# Patient Record
Sex: Female | Born: 1950 | Marital: Single | Smoking: Never smoker
Health system: Southern US, Community
[De-identification: ages and names within clinical notes are randomized; demographics above are authoritative.]

## PROBLEM LIST (undated history)

## (undated) DIAGNOSIS — E119 Type 2 diabetes mellitus without complications: Secondary | ICD-10-CM

## (undated) DIAGNOSIS — F419 Anxiety disorder, unspecified: Secondary | ICD-10-CM

## (undated) DIAGNOSIS — N9489 Other specified conditions associated with female genital organs and menstrual cycle: Secondary | ICD-10-CM

## (undated) DIAGNOSIS — C541 Malignant neoplasm of endometrium: Secondary | ICD-10-CM

## (undated) DIAGNOSIS — C181 Malignant neoplasm of appendix: Secondary | ICD-10-CM

## (undated) HISTORY — DX: Malignant neoplasm of appendix: C18.1

## (undated) HISTORY — PX: APPENDECTOMY: SHX54

## (undated) HISTORY — DX: Other specified conditions associated with female genital organs and menstrual cycle: N94.89

## (undated) HISTORY — DX: Malignant neoplasm of endometrium: C54.1

## (undated) HISTORY — PX: NO PAST SURGERIES: SHX2092

## (undated) HISTORY — DX: Anxiety disorder, unspecified: F41.9

---

## 2012-06-10 DIAGNOSIS — M79673 Pain in unspecified foot: Secondary | ICD-10-CM | POA: Insufficient documentation

## 2012-12-09 DIAGNOSIS — E6609 Other obesity due to excess calories: Secondary | ICD-10-CM | POA: Insufficient documentation

## 2015-11-28 DIAGNOSIS — F419 Anxiety disorder, unspecified: Secondary | ICD-10-CM | POA: Diagnosis not present

## 2015-11-28 DIAGNOSIS — E669 Obesity, unspecified: Secondary | ICD-10-CM | POA: Diagnosis not present

## 2017-06-24 DIAGNOSIS — F419 Anxiety disorder, unspecified: Secondary | ICD-10-CM | POA: Diagnosis not present

## 2017-11-17 DIAGNOSIS — F419 Anxiety disorder, unspecified: Secondary | ICD-10-CM | POA: Insufficient documentation

## 2017-11-17 DIAGNOSIS — F32A Depression, unspecified: Secondary | ICD-10-CM | POA: Insufficient documentation

## 2017-11-17 DIAGNOSIS — R399 Unspecified symptoms and signs involving the genitourinary system: Secondary | ICD-10-CM | POA: Diagnosis not present

## 2017-11-17 DIAGNOSIS — N3090 Cystitis, unspecified without hematuria: Secondary | ICD-10-CM | POA: Diagnosis not present

## 2017-12-30 DIAGNOSIS — F419 Anxiety disorder, unspecified: Secondary | ICD-10-CM | POA: Diagnosis not present

## 2017-12-30 DIAGNOSIS — F329 Major depressive disorder, single episode, unspecified: Secondary | ICD-10-CM | POA: Diagnosis not present

## 2018-05-06 DIAGNOSIS — F419 Anxiety disorder, unspecified: Secondary | ICD-10-CM | POA: Diagnosis not present

## 2018-05-06 DIAGNOSIS — F329 Major depressive disorder, single episode, unspecified: Secondary | ICD-10-CM | POA: Diagnosis not present

## 2018-07-01 ENCOUNTER — Other Ambulatory Visit (HOSPITAL_COMMUNITY)
Admission: RE | Admit: 2018-07-01 | Discharge: 2018-07-01 | Disposition: A | Discharge status: Home / Self Care | Payer: PPO | Source: Ambulatory Visit | Attending: Obstetrics and Gynecology | Admitting: Obstetrics and Gynecology

## 2018-07-01 ENCOUNTER — Ambulatory Visit (INDEPENDENT_AMBULATORY_CARE_PROVIDER_SITE_OTHER): Payer: PPO | Admitting: Obstetrics and Gynecology

## 2018-07-01 ENCOUNTER — Encounter: Payer: Self-pay | Admitting: Obstetrics and Gynecology

## 2018-07-01 VITALS — BP 140/92 | Ht 65.0 in | Wt 230.0 lb

## 2018-07-01 DIAGNOSIS — L989 Disorder of the skin and subcutaneous tissue, unspecified: Secondary | ICD-10-CM | POA: Insufficient documentation

## 2018-07-01 DIAGNOSIS — N950 Postmenopausal bleeding: Secondary | ICD-10-CM | POA: Diagnosis not present

## 2018-07-01 DIAGNOSIS — Z1151 Encounter for screening for human papillomavirus (HPV): Secondary | ICD-10-CM | POA: Insufficient documentation

## 2018-07-01 DIAGNOSIS — N858 Other specified noninflammatory disorders of uterus: Secondary | ICD-10-CM | POA: Diagnosis not present

## 2018-07-01 NOTE — Progress Notes (Signed)
Obstetrics & Gynecology Office Visit   Chief Complaint  Patient presents with  . Menstrual Problem    PMB   History of Present Illness: 68 y.o. [id number] female who presents with postmenopausal bleeding. She first noted some bright-pink tinge on her toilet paper when she went to the bathroom.  This resolved. About 3 weeks ago something similar happened. There was red color on the tissue, but not every day.  The second week there was a richer color to the discharge.  This has stopped for now.  She went through menopause about 15 years ago.  Her last pap smear was 10-15 years ago.  She has never had an abnormal pap smear.  She has noted only weight gain.  She has no bloating.  She has not noted new early satiety.  Denies new constipation.  She has never had postmenopausal bleeding since menopause apart from the above.   Past Medical History:  Diagnosis Date  . Anxiety     Past Surgical History:  Procedure Laterality Date  . NO PAST SURGERIES      Gynecologic History: No LMP recorded. Patient is postmenopausal.  Obstetric History: [id number]  Family History  Problem Relation Age of Onset  . Cancer Mother 80       thyroid cancer  . Skin cancer Sister   . Breast cancer Maternal Grandmother 9    Social History   Socioeconomic History  . Marital status: Single    Spouse name: Not on file  . Number of children: Not on file  . Years of education: Not on file  . Highest education level: Not on file  Occupational History  . Not on file  Social Needs  . Financial resource strain: Not on file  . Food insecurity:    Worry: Not on file    Inability: Not on file  . Transportation needs:    Medical: Not on file    Non-medical: Not on file  Tobacco Use  . Smoking status: Never Smoker  . Smokeless tobacco: Never Used  Substance and Sexual Activity  . Alcohol use: Yes    Comment: has nearly quit. Perhaps two beers every third day  . Drug use: Never  . Sexual activity: Not  Currently    Birth control/protection: Post-menopausal  Lifestyle  . Physical activity:    Days per week: Not on file    Minutes per session: Not on file  . Stress: Not on file  Relationships  . Social connections:    Talks on phone: Not on file    Gets together: Not on file    Attends religious service: Not on file    Active member of club or organization: Not on file    Attends meetings of clubs or organizations: Not on file    Relationship status: Not on file  . Intimate partner violence:    Fear of current or ex partner: Not on file    Emotionally abused: Not on file    Physically abused: Not on file    Forced sexual activity: Not on file  Other Topics Concern  . Not on file  Social History Narrative  . Not on file    No Known Allergies  Prior to Admission medications   Medication Sig Start Date End Date Taking? Authorizing Provider  ALPRAZolam (XANAX) 1 MG tablet TAKE 1 TABLET BY MOUTH 3 TIMES A DAY AS NEEDED FOR SLEEP 06/24/17  Yes [provider]    Review of Systems  Constitutional:  Negative.   HENT: Negative.   Eyes: Negative.   Respiratory: Negative.   Cardiovascular: Negative.   Gastrointestinal: Negative.   Genitourinary: Negative.   Musculoskeletal: Negative.   Skin: Negative.   Neurological: Negative.   Psychiatric/Behavioral: Negative.      Physical Exam BP (!) 140/92   Ht 5\' 5"  (1.651 m)   Wt 230 lb (104.3 kg)   BMI 38.27 kg/m  No LMP recorded. Patient is postmenopausal. Physical Exam Constitutional:      General: She is not in acute distress.    Appearance: She is well-developed.  Genitourinary:     Pelvic exam was performed with patient in the lithotomy position.     Vulva, inguinal canal, urethra, bladder, vagina, uterus, right adnexa and left adnexa normal.     No posterior fourchette tenderness, injury or lesion present.        No cervical discharge, erythema, bleeding, polyp (possible small polypoid lesions at entrance to  internal os of cervix. no other lesions noted) or nabothian cyst.     Genitourinary Comments: Bimanual extremely limited by patient's body habitus  HENT:     Head: Normocephalic and atraumatic.  Eyes:     General: No scleral icterus.    Conjunctiva/sclera: Conjunctivae normal.  Neck:     Musculoskeletal: Normal range of motion and neck supple.  Cardiovascular:     Rate and Rhythm: Normal rate and regular rhythm.     Heart sounds: No murmur. No friction rub. No gallop.   Pulmonary:     Effort: Pulmonary effort is normal. No respiratory distress.     Breath sounds: Normal breath sounds. No wheezing or rales.  Abdominal:     General: Bowel sounds are normal. There is no distension.     Palpations: Abdomen is soft. There is no mass.     Tenderness: There is no abdominal tenderness. There is no guarding or rebound.  Musculoskeletal: Normal range of motion.  Neurological:     Mental Status: She is alert and oriented to person, place, and time.     Cranial Nerves: No cranial nerve deficit.  Skin:    General: Skin is warm and dry.     Findings: No erythema.  Psychiatric:        Mood and Affect: Mood normal.        Behavior: Behavior normal.        Judgment: Judgment normal.    Endometrial Biopsy After discussion with the patient regarding her abnormal uterine bleeding I recommended that she proceed with an endometrial biopsy for further diagnosis. The risks, benefits, alternatives, and indications for an endometrial biopsy were discussed with the patient in detail. She understood the risks including infection, bleeding, cervical laceration and uterine perforation.  Verbal consent was obtained.   PROCEDURE NOTE:  Pipelle endometrial biopsy was performed using aseptic technique with iodine preparation.  The uterus was sounded to a length of 7.5 cm.  Adequate sampling was obtained with minimal blood loss.  The patient tolerated the procedure well.  Disposition will be pending  pathology.  Female chaperone present for pelvic and breast  portions of the physical exam  Assessment: 68 y.o. [id number] female here for  1. Postmenopausal bleeding   2. Skin lesion of right lower extremity      Plan: Problem List Items Addressed This Visit      Musculoskeletal and Integument   Skin lesion of right lower extremity     Other   Postmenopausal bleeding - Primary  Relevant Orders   Cytology - PAP   Surgical pathology   US PELVIS TRANSVANGINAL NON-OB (TV ONLY)     Discussed multiple possible etiologies of postmenopausal bleeding, including; endometrial atrophy, endometrial polyp, cancer.  We also discussed findings on exam that could indicate that she has a very small polypoid lesion at the entrance to her external cervical os.  Will await pap smear and consider biopsy removal with Tischler forceps.  Will get pelvic ultrasound in addition to pap smear and endometrial biopsy performed today.  Skin lesion: I strongly suggested that she see a dermatologist to have this evaluated and possibly removed. I offered to remove it for her. But, I would need to do it in the procedure room.  She states that she will see a dermatologist very soon.    30 minutes spent in face to face discussion with > 50% spent in counseling,management, and coordination of care of her postmenopausal bleeding.   Prentice Docker, MD 07/01/2018 1:16 PM

## 2018-07-02 LAB — CYTOLOGY - PAP
DIAGNOSIS: NEGATIVE
HPV: NOT DETECTED

## 2018-07-06 ENCOUNTER — Telehealth: Payer: Self-pay | Admitting: Obstetrics and Gynecology

## 2018-07-06 NOTE — Telephone Encounter (Signed)
Discussed normal pap smear and normal endometrial biopsy. Recommend she follow through with the pelvic ultrasound, as there may be a single focus of either a polyp or abnormal tissue, which might better be detected by ultrasound and could have been missed by an endometrial biopsy. She voiced understanding and agreement with the plan.

## 2018-07-22 ENCOUNTER — Ambulatory Visit (INDEPENDENT_AMBULATORY_CARE_PROVIDER_SITE_OTHER): Payer: PPO

## 2018-07-22 ENCOUNTER — Ambulatory Visit (INDEPENDENT_AMBULATORY_CARE_PROVIDER_SITE_OTHER): Payer: PPO | Admitting: Obstetrics and Gynecology

## 2018-07-22 ENCOUNTER — Encounter: Payer: Self-pay | Admitting: Obstetrics and Gynecology

## 2018-07-22 VITALS — BP 128/88 | Ht 66.0 in | Wt 232.0 lb

## 2018-07-22 DIAGNOSIS — N950 Postmenopausal bleeding: Secondary | ICD-10-CM

## 2018-07-22 DIAGNOSIS — N9489 Other specified conditions associated with female genital organs and menstrual cycle: Secondary | ICD-10-CM | POA: Insufficient documentation

## 2018-07-22 DIAGNOSIS — N83202 Unspecified ovarian cyst, left side: Secondary | ICD-10-CM

## 2018-07-22 NOTE — Progress Notes (Signed)
Gynecology Ultrasound Follow Up   Chief Complaint  Patient presents with  . Follow-up  Postmenopausal bleeding   History of Present Illness: Patient is a 68 y.o. female who presents today for ultrasound evaluation of the above .  Ultrasound demonstrates the following findings Adnexa: left ovarian cysts noted to be less than 1 cm Uterus: retroverted, with endometrial stripe  29.4 mm and cystic and hypervascular Additional: no free fluid in cul-de-sac  Recent normal pap smear with negative HPV, Recent normal endometrial biopsy  Past Medical History:  Diagnosis Date  . Anxiety     Past Surgical History:  Procedure Laterality Date  . NO PAST SURGERIES      Family History  Problem Relation Age of Onset  . Cancer Mother 91       thyroid cancer  . Skin cancer Sister   . Breast cancer Maternal Grandmother 55    Social History   Socioeconomic History  . Marital status: Single    Spouse name: Not on file  . Number of children: Not on file  . Years of education: Not on file  . Highest education level: Not on file  Occupational History  . Not on file  Social Needs  . Financial resource strain: Not on file  . Food insecurity:    Worry: Not on file    Inability: Not on file  . Transportation needs:    Medical: Not on file    Non-medical: Not on file  Tobacco Use  . Smoking status: Never Smoker  . Smokeless tobacco: Never Used  Substance and Sexual Activity  . Alcohol use: Yes    Comment: has nearly quit. Perhaps two beers every third day  . Drug use: Never  . Sexual activity: Not Currently    Birth control/protection: Post-menopausal  Lifestyle  . Physical activity:    Days per week: Not on file    Minutes per session: Not on file  . Stress: Not on file  Relationships  . Social connections:    Talks on phone: Not on file    Gets together: Not on file    Attends religious service: Not on file    Active member of club or organization: Not on file   Attends meetings of clubs or organizations: Not on file    Relationship status: Not on file  . Intimate partner violence:    Fear of current or ex partner: Not on file    Emotionally abused: Not on file    Physically abused: Not on file    Forced sexual activity: Not on file  Other Topics Concern  . Not on file  Social History Narrative  . Not on file    No Known Allergies  Prior to Admission medications   Medication Sig Start Date End Date Taking? Authorizing Provider  ALPRAZolam (XANAX) 1 MG tablet TAKE 1 TABLET BY MOUTH 3 TIMES A DAY AS NEEDED FOR SLEEP 06/24/17   [provider]    Physical Exam BP 128/88   Ht 5\' 6"  (1.676 m)   Wt 232 lb (105.2 kg)   BMI 37.45 kg/m    General: NAD HEENT: normocephalic, anicteric Pulmonary: No increased work of breathing Extremities: no edema, erythema, or tenderness Neurologic: Grossly intact, normal gait Psychiatric: mood appropriate, affect full  Imaging Results: US Pelvis Transvanginal Non-ob (tv Only)  Result Date: 07/22/2018 Patient Name: Sue Calhoun DOB: Jan 16, 1951 MRN: [id number] ULTRASOUND REPORT Location: [facility] Date of Service: 07/22/2018 Indications: Postmenopausal bleeding Findings: The  uterus is retroverted and measures 8.5 x 5.4 x 4.7cm. Echo texture is homogenous without evidence of focal masses. The Endometrium is abnormal. It is heterogeneous with multi-cystic areas and hypervascular. It is thickened measuring 29.4 mm. Bilateral ovaries not well seen Right Ovary measures 2.0 x 1.5 x 1.5cm. It is normal in appearance. Left Ovary measures 2.4 x 17 x 1.3cm with two small cyst less than 1cm. Survey of the adnexa demonstrates no adnexal masses. There is no free fluid in the cul de sac. Impression: 1. Heterogeneous, hypervascular and thickened endometrium. Recommendations: 1.Clinical correlation with the patient's History and Physical Exam. 2. Endometrial sampling with concern for abnormal endometrial growth, as  clinically indicated. Vita Barley, RDMS RVT The ultrasound images and findings were reviewed by me and I agree with the above report. Prentice Docker, MD, [facility], [facility] 07/22/2018 11:28 AM      Assessment: 68 y.o. [id number]  1. Postmenopausal bleeding   2. Endometrial mass      Plan: Problem List Items Addressed This Visit      Genitourinary   Endometrial mass     Other   Postmenopausal bleeding - Primary     Reviewed results of ultrasound.  Reviewed my concerns given the ultrasound findings that are not consistent with prior biopsy results.  Strongly recommend surgery to do further investigation.  Recommended surgery is a hysteroscopy with dilation and curettage and removal of endometrial mass.  The patient voiced understanding of the findings and agreement to proceed.  We will arrange for soon as possible.  20 minutes spent in face to face discussion with > 50% spent in counseling,management, and coordination of care of her postmenopausal bleeding and endometrial mass.

## 2018-07-23 ENCOUNTER — Telehealth: Payer: Self-pay | Admitting: Obstetrics and Gynecology

## 2018-07-23 NOTE — Telephone Encounter (Signed)
-----   Message from Will Bonnet, MD sent at 07/22/2018 11:46 AM EST ----- Regarding: Schedule surgery Surgery Booking Request Patient Full Name:  Sue Calhoun  MRN: [id number]  DOB: 08/16/1950  Surgeon: Prentice Docker, MD  Requested Surgery Date and Time: 07/29/2018 Primary Diagnosis AND Code: postmenopausal bleeding, endometrial mass Secondary Diagnosis and Code:  Surgical Procedure: hysteroscopy, dilation and curettage, removal of endometrial mass L&D Notification: No Admission Status: same day surgery Length of Surgery: 45 minutes Special Case Needs: myosure hysteroscope and attachments H&P: TBD (date) (may do day of surgery) Phone Interview???: no Interpreter: Language:  Medical Clearance: no Special Scheduling Instructions: none

## 2018-07-23 NOTE — Telephone Encounter (Signed)
Patient is aware Dr Glennon Mac will get consents day of surgery, Pre-admit Testing appointment on 07/26/18 @ 2:30pm at [place], first office on the right, and OR on 07/29/18. Patient is aware she may receive calls from the [facility] and [facility]. Patient said she has Medicare and [employer], and no other policies. Patient was given the phone# to Same Day Surgery and is aware she should call on 07/26/18 @ 1-3pm for an arrival time. Patient is aware she should have someone drive her on day of surgery, no [facility] Po[facility] or other car service. Ext given.

## 2018-07-26 ENCOUNTER — Other Ambulatory Visit: Payer: Self-pay

## 2018-07-26 ENCOUNTER — Encounter: Payer: Self-pay | Admitting: Anesthesiology

## 2018-07-26 ENCOUNTER — Encounter
Admission: RE | Admit: 2018-07-26 | Discharge: 2018-07-26 | Disposition: A | Discharge status: Home / Self Care | Payer: PPO | Source: Ambulatory Visit | Attending: Obstetrics and Gynecology | Admitting: Obstetrics and Gynecology

## 2018-07-26 DIAGNOSIS — Z01818 Encounter for other preprocedural examination: Secondary | ICD-10-CM | POA: Insufficient documentation

## 2018-07-26 DIAGNOSIS — Z01810 Encounter for preprocedural cardiovascular examination: Secondary | ICD-10-CM | POA: Diagnosis not present

## 2018-07-26 LAB — CBC
HCT: 42.5 % (ref 36.0–46.0)
Hemoglobin: 14.1 g/dL (ref 12.0–15.0)
MCH: 31.5 pg (ref 26.0–34.0)
MCHC: 33.2 g/dL (ref 30.0–36.0)
MCV: 95.1 fL (ref 80.0–100.0)
Platelets: 358 10*3/uL (ref 150–400)
RBC: 4.47 MIL/uL (ref 3.87–5.11)
RDW: 12.8 % (ref 11.5–15.5)
WBC: 11.8 10*3/uL — ABNORMAL HIGH (ref 4.0–10.5)
nRBC: 0 % (ref 0.0–0.2)

## 2018-07-26 NOTE — Patient Instructions (Signed)
  Your procedure is scheduled on: Thursday July 29, 2018 Report to Same Day Surgery 2nd floor [facility] Entrance-take elevator on left to 2nd floor.  Check in with surgery information desk.) To find out your arrival time, call [phone] 1:00-3:00 PM on Wednesday July 28, 2018  Remember: Instructions that are not followed completely may result in serious medical risk, up to and including death, or upon the discretion of your surgeon and anesthesiologist your surgery may need to be rescheduled.    __x__ 1. Do not eat food (including mints, candies, chewing gum) after midnight the night before your procedure. You may drink clear liquids up to 2 hours before you are scheduled to arrive at the hospital for your procedure.  Do not drink anything within 2 hours of your scheduled arrival to the hospital.  Approved clear liquids:  --Water or Apple juice without pulp  --Clear carbohydrate beverage such as Gatorade or Powerade  --Black Coffee or Clear Tea (No milk, no creamers, do not add anything to the coffee or tea)    __x__ 2. No Alcohol for 24 hours before or after surgery.   __x__ 3. No Smoking or e-cigarettes for 24 hours before surgery.  Do not use any chewable tobacco products for at least 6 hours before surgery.   __x__ 4. Notify your doctor if there is any change in your medical condition (cold, fever, infections).   __x__ 5. On the morning of surgery brush your teeth with toothpaste and water.  You may rinse your mouth with mouthwash if you wish.  Do not swallow any toothpaste or mouthwash.   __x__ Use antibacterial soap such as Dial to shower/bathe on the day of surgery, if available.   Do not wear jewelry, make-up, hairpins, clips or nail polish on the day of surgery.  Do not wear lotions, powders, deodorant, or perfumes.   Do not shave below the face/neck 48 hours prior to surgery.   Do not bring valuables to the hospital.    [facility] is not responsible  for any belongings or valuables.               Contacts, dentures or bridgework may not be worn into surgery.  For patients discharged on the day of surgery, you will NOT be permitted to drive yourself home.  You must have a responsible adult with you for 24 hours after surgery.  __x__ Take these on the morning of surgery with a SMALL SIP OF WATER:  1. None   __x__ Follow recommendations from Cardiologist, Pulmonologist or PCP regarding stopping Aspirin, Coumadin, Plavix, Eliquis, Effient, Pradaxa, and Pletal.  __x__ Avoid Anti-inflammatories such as Advil, Ibuprofen, Motrin, Aleve, Naproxen, Naprosyn, BC/Goodies powders or aspirin products. You may continue to take Tylenol and Celebrex.   __x__ Avoid supplements until after surgery. You may continue to take Vitamin D, Vitamin B, and multivitamin.

## 2018-07-26 NOTE — Pre-Procedure Instructions (Addendum)
EKG reviewed by Dr. Ronelle Nigh (anesthesia); no previous EKG's to compare; requesting medical clearance prior to upcoming surgery. The patient was called to confirm PCP. Stated that Dr. Flonnie Overman had recently retired but another provider has taken his place. Clearance form faxed to Dr. Lennie Odor [facility]) office.

## 2018-07-28 NOTE — Pre-Procedure Instructions (Signed)
DR Glennon Mac MESSAGED WOULD LIKE CLEARANCE ASAP. SPOKE WITH DENISE AT DR HOLMES AND GAVE DR [place]

## 2018-07-28 NOTE — Pre-Procedure Instructions (Signed)
SPOKE WITH DANA AT DR HOLMES. THEY HAVE LEFT MESSAGE FOR PATIENT TO CALL THEIR OFFICE. THEY ARE REFERRING TO CARDIOLOGIST. MESSAGED DR [place] SPOKE WITH NANCY

## 2018-07-29 ENCOUNTER — Ambulatory Visit: Admission: RE | Admit: 2018-07-29 | Payer: PPO | Source: Home / Self Care | Admitting: Obstetrics and Gynecology

## 2018-07-29 ENCOUNTER — Encounter: Admission: RE | Payer: Self-pay | Source: Home / Self Care

## 2018-07-29 SURGERY — DILATATION & CURETTAGE/HYSTEROSCOPY WITH MYOSURE
Anesthesia: Choice

## 2018-08-11 DIAGNOSIS — J019 Acute sinusitis, unspecified: Secondary | ICD-10-CM | POA: Diagnosis not present

## 2018-08-11 DIAGNOSIS — J209 Acute bronchitis, unspecified: Secondary | ICD-10-CM | POA: Diagnosis not present

## 2019-06-09 DIAGNOSIS — F419 Anxiety disorder, unspecified: Secondary | ICD-10-CM | POA: Diagnosis not present

## 2019-06-09 DIAGNOSIS — Z1211 Encounter for screening for malignant neoplasm of colon: Secondary | ICD-10-CM | POA: Diagnosis not present

## 2019-06-09 DIAGNOSIS — Z0000 Encounter for general adult medical examination without abnormal findings: Secondary | ICD-10-CM | POA: Diagnosis not present

## 2019-09-15 DIAGNOSIS — F419 Anxiety disorder, unspecified: Secondary | ICD-10-CM | POA: Diagnosis not present

## 2019-12-22 DIAGNOSIS — F419 Anxiety disorder, unspecified: Secondary | ICD-10-CM | POA: Diagnosis not present

## 2020-01-30 DIAGNOSIS — L03213 Periorbital cellulitis: Secondary | ICD-10-CM | POA: Diagnosis not present

## 2020-04-05 DIAGNOSIS — F419 Anxiety disorder, unspecified: Secondary | ICD-10-CM | POA: Diagnosis not present

## 2020-07-17 DIAGNOSIS — E6609 Other obesity due to excess calories: Secondary | ICD-10-CM | POA: Diagnosis not present

## 2020-07-17 DIAGNOSIS — F419 Anxiety disorder, unspecified: Secondary | ICD-10-CM | POA: Diagnosis not present

## 2020-07-17 DIAGNOSIS — Z0000 Encounter for general adult medical examination without abnormal findings: Secondary | ICD-10-CM | POA: Diagnosis not present

## 2020-07-17 DIAGNOSIS — Z1231 Encounter for screening mammogram for malignant neoplasm of breast: Secondary | ICD-10-CM | POA: Diagnosis not present

## 2020-07-17 DIAGNOSIS — Z1159 Encounter for screening for other viral diseases: Secondary | ICD-10-CM | POA: Diagnosis not present

## 2020-07-17 DIAGNOSIS — Z6836 Body mass index (BMI) 36.0-36.9, adult: Secondary | ICD-10-CM | POA: Diagnosis not present

## 2020-07-17 DIAGNOSIS — R739 Hyperglycemia, unspecified: Secondary | ICD-10-CM | POA: Diagnosis not present

## 2020-10-17 DIAGNOSIS — R739 Hyperglycemia, unspecified: Secondary | ICD-10-CM | POA: Diagnosis not present

## 2020-10-24 DIAGNOSIS — Z6836 Body mass index (BMI) 36.0-36.9, adult: Secondary | ICD-10-CM | POA: Diagnosis not present

## 2020-10-24 DIAGNOSIS — E119 Type 2 diabetes mellitus without complications: Secondary | ICD-10-CM | POA: Diagnosis not present

## 2020-10-24 DIAGNOSIS — F419 Anxiety disorder, unspecified: Secondary | ICD-10-CM | POA: Diagnosis not present

## 2020-10-24 DIAGNOSIS — F5101 Primary insomnia: Secondary | ICD-10-CM | POA: Diagnosis not present

## 2020-10-24 DIAGNOSIS — E6609 Other obesity due to excess calories: Secondary | ICD-10-CM | POA: Diagnosis not present

## 2021-04-18 DIAGNOSIS — E6609 Other obesity due to excess calories: Secondary | ICD-10-CM | POA: Diagnosis not present

## 2021-04-18 DIAGNOSIS — F5101 Primary insomnia: Secondary | ICD-10-CM | POA: Diagnosis not present

## 2021-04-18 DIAGNOSIS — Z6836 Body mass index (BMI) 36.0-36.9, adult: Secondary | ICD-10-CM | POA: Diagnosis not present

## 2021-04-18 DIAGNOSIS — F419 Anxiety disorder, unspecified: Secondary | ICD-10-CM | POA: Diagnosis not present

## 2021-04-18 DIAGNOSIS — E119 Type 2 diabetes mellitus without complications: Secondary | ICD-10-CM | POA: Diagnosis not present

## 2021-04-25 DIAGNOSIS — E119 Type 2 diabetes mellitus without complications: Secondary | ICD-10-CM | POA: Diagnosis not present

## 2021-04-25 DIAGNOSIS — Z6836 Body mass index (BMI) 36.0-36.9, adult: Secondary | ICD-10-CM | POA: Diagnosis not present

## 2021-04-25 DIAGNOSIS — E6609 Other obesity due to excess calories: Secondary | ICD-10-CM | POA: Diagnosis not present

## 2021-04-25 DIAGNOSIS — F419 Anxiety disorder, unspecified: Secondary | ICD-10-CM | POA: Diagnosis not present

## 2021-04-25 DIAGNOSIS — F5101 Primary insomnia: Secondary | ICD-10-CM | POA: Diagnosis not present

## 2021-04-25 DIAGNOSIS — E782 Mixed hyperlipidemia: Secondary | ICD-10-CM | POA: Diagnosis not present

## 2021-07-17 ENCOUNTER — Emergency Department: Payer: No Typology Code available for payment source

## 2021-07-17 ENCOUNTER — Encounter: Payer: Self-pay | Admitting: Emergency Medicine

## 2021-07-17 ENCOUNTER — Telehealth: Payer: Self-pay

## 2021-07-17 ENCOUNTER — Emergency Department
Admission: EM | Admit: 2021-07-17 | Discharge: 2021-07-17 | Disposition: A | Discharge status: Home / Self Care | Payer: No Typology Code available for payment source | Attending: Emergency Medicine | Admitting: Emergency Medicine

## 2021-07-17 ENCOUNTER — Other Ambulatory Visit: Payer: Self-pay

## 2021-07-17 DIAGNOSIS — N8502 Endometrial intraepithelial neoplasia [EIN]: Secondary | ICD-10-CM | POA: Diagnosis not present

## 2021-07-17 DIAGNOSIS — N939 Abnormal uterine and vaginal bleeding, unspecified: Secondary | ICD-10-CM | POA: Diagnosis not present

## 2021-07-17 DIAGNOSIS — N858 Other specified noninflammatory disorders of uterus: Secondary | ICD-10-CM | POA: Diagnosis not present

## 2021-07-17 DIAGNOSIS — Z780 Asymptomatic menopausal state: Secondary | ICD-10-CM | POA: Diagnosis not present

## 2021-07-17 DIAGNOSIS — N9489 Other specified conditions associated with female genital organs and menstrual cycle: Secondary | ICD-10-CM

## 2021-07-17 DIAGNOSIS — N950 Postmenopausal bleeding: Secondary | ICD-10-CM | POA: Diagnosis not present

## 2021-07-17 DIAGNOSIS — N8500 Endometrial hyperplasia, unspecified: Secondary | ICD-10-CM | POA: Diagnosis not present

## 2021-07-17 DIAGNOSIS — R9389 Abnormal findings on diagnostic imaging of other specified body structures: Secondary | ICD-10-CM | POA: Diagnosis not present

## 2021-07-17 LAB — CBC WITH DIFFERENTIAL/PLATELET
Abs Immature Granulocytes: 0.03 10*3/uL (ref 0.00–0.07)
Basophils Absolute: 0.1 10*3/uL (ref 0.0–0.1)
Basophils Relative: 1 %
Eosinophils Absolute: 0.2 10*3/uL (ref 0.0–0.5)
Eosinophils Relative: 1 %
HCT: 40.1 % (ref 36.0–46.0)
Hemoglobin: 13.4 g/dL (ref 12.0–15.0)
Immature Granulocytes: 0 %
Lymphocytes Relative: 40 %
Lymphs Abs: 4.7 10*3/uL — ABNORMAL HIGH (ref 0.7–4.0)
MCH: 31.5 pg (ref 26.0–34.0)
MCHC: 33.4 g/dL (ref 30.0–36.0)
MCV: 94.1 fL (ref 80.0–100.0)
Monocytes Absolute: 0.7 10*3/uL (ref 0.1–1.0)
Monocytes Relative: 6 %
Neutro Abs: 6.3 10*3/uL (ref 1.7–7.7)
Neutrophils Relative %: 52 %
Platelets: 352 10*3/uL (ref 150–400)
RBC: 4.26 MIL/uL (ref 3.87–5.11)
RDW: 12.8 % (ref 11.5–15.5)
WBC: 12 10*3/uL — ABNORMAL HIGH (ref 4.0–10.5)
nRBC: 0 % (ref 0.0–0.2)

## 2021-07-17 LAB — BASIC METABOLIC PANEL
Anion gap: 6 (ref 5–15)
BUN: 17 mg/dL (ref 8–23)
CO2: 27 mmol/L (ref 22–32)
Calcium: 8.9 mg/dL (ref 8.9–10.3)
Chloride: 103 mmol/L (ref 98–111)
Creatinine, Ser: 0.71 mg/dL (ref 0.44–1.00)
GFR, Estimated: >60 mL/min (ref >60)
Glucose, Bld: 156 mg/dL — ABNORMAL HIGH (ref 70–99)
Potassium: 3.7 mmol/L (ref 3.5–5.1)
Sodium: 136 mmol/L (ref 135–145)

## 2021-07-17 NOTE — Telephone Encounter (Signed)
Pt calling; is 71yr old and bleeding; there is something hanging out of the vagina like a tampon that is half way in and half way out; the soonest appt she could get is Feb 2nd; she has tried for two hours to find a sooner appointment but only got the run around - call here, call there, call that one; wants to see a GYM; if she starts bleeding bad or gushing blood or bleeds more than 1 wk should she go back to the ER.  [phone]

## 2021-07-17 NOTE — ED Triage Notes (Signed)
Pt to ED from home c/o vaginal bleeding for a couple days and pelvic discomfort.  States noticed light bright red blood on toilet paper a couple days ago getting heavier and some clots but still bright red.  States also feels like something is hanging out of her vagina.  Denies urinary changes or n/v/d.  Pt A&Ox4, chest rise even and unlabored, skin WNL and in NAD at this time.

## 2021-07-17 NOTE — ED Provider Notes (Signed)
[facility] Provider Note    Event Date/Time   First MD Initiated Contact with Patient 07/17/21 [phone]     (approximate)   History   Chief Complaint Vaginal Bleeding   HPI  Sue TEGELER is a 71 y.o. female with no significant past medical history presents the ED complaining of vaginal bleeding.  Patient reports that she has noticed increasing vaginal bleeding over the past 24 to 48 hours.  She states that she was initially having to change a pad only once about every 5-6 hours, more recently has been changing a pad about every 2 hours.  When she felt the area yesterday, she felt like there was something sticking out of her vaginal introitus.  She denies any associated pain and has not had any discharge.  She denies any fevers, dysuria, flank pain, nausea, vomiting, or changes in bowel movements.  She states she dealt with similar symptoms about 3 years ago, was scheduled for hysteroscopy at that time, but this was canceled due to a viral illness.  She states that she has not seen an OB/GYN since then but has not had any issues since then either.  She does not take any blood thinners.      Physical Exam   Triage Vital Signs: ED Triage Vitals  Enc Vitals Group     BP 07/17/21 0040 (!) 169/88     Pulse Rate 07/17/21 0040 70     Resp 07/17/21 0040 14     Temp --      Temp src --      SpO2 07/17/21 0040 98 %     Weight 07/17/21 0041 224 lb (101.6 kg)     Height 07/17/21 0041 5\' 5"  (1.651 m)     Head Circumference --      Peak Flow --      Pain Score 07/17/21 0041 3     Pain Loc --      Pain Edu? --      Excl. in [place]? --     Most recent vital signs: Vitals:   07/17/21 0040 07/17/21 0244  BP: (!) 169/88 135/68  Pulse: 70 (!) 58  Resp: 14 16  Temp:  98.4 F (36.9 C)  SpO2: 98% 97%    Constitutional: Alert and oriented. Eyes: Conjunctivae are normal. Head: Atraumatic. Nose: No congestion/rhinnorhea. Mouth/Throat: Mucous membranes are moist.   Cardiovascular: Normal rate, regular rhythm. Grossly normal heart sounds.  2+ radial pulses bilaterally. Respiratory: Normal respiratory effort.  No retractions. Lungs CTAB. Gastrointestinal: Soft and nontender. No distention. Genitourinary: Vascular appearing mass just past the vaginal introitus with moderate amount of blood in the vaginal vault.  No associated tenderness to palpation noted. Musculoskeletal: No lower extremity tenderness nor edema.  Neurologic:  Normal speech and language. No gross focal neurologic deficits are appreciated.    ED Results / Procedures / Treatments   Labs (all labs ordered are listed, but only abnormal results are displayed) Labs Reviewed  CBC WITH DIFFERENTIAL/PLATELET - Abnormal; Notable for the following components:      Result Value   WBC 12.0 (*)    Lymphs Abs 4.7 (*)    All other components within normal limits  BASIC METABOLIC PANEL - Abnormal; Notable for the following components:   Glucose, Bld 156 (*)    All other components within normal limits     RADIOLOGY Pelvic ultrasound reviewed by me with thickened endometrium similar to previous.  PROCEDURES:  Critical Care performed:  No  Procedures   MEDICATIONS ORDERED IN ED: Medications - No data to display   IMPRESSION / MDM / [place] / ED COURSE  I reviewed the triage vital signs and the nursing notes.                              71 y.o. female with no significant past medical history presents to the ED with 24 to 48 hours of vaginal bleeding along with apparent mass just past her vaginal introitus.  Differential diagnosis includes, but is not limited to, malignancy, fibroid uterus, anemia, infectious process.  Patient is well-appearing and in no acute distress, vital signs are stable and minimal active bleeding noted at this time.  Labs are reassuring, hemoglobin stable from previous and BMP shows no electrolyte abnormality.  Exam is concerning for mass near the  vaginal introitus and we will further assess with ultrasound.  Plan to discuss with OB/GYN.  Pelvic ultrasound consistent with thickened and heterogeneous endometrium, similar to previous and concerning for malignancy.  I shared my concerns with patient and spoke with midwife [facility] of [facility], who will assist with timely outpatient follow-up for reevaluation and potential biopsy.  Patient with minimal ongoing bleeding at this time and is appropriate for outpatient management, was counseled to return to the ED for new worsening symptoms.  Patient agrees with plan.       FINAL CLINICAL IMPRESSION(S) / ED DIAGNOSES   Final diagnoses:  Vaginal bleeding  Endometrial mass     Rx / DC Orders   ED Discharge Orders     None        Note:  This document was prepared using Dragon voice recognition software and may include unintentional dictation errors.   Blake Divine, MD 07/17/21 [phone]

## 2021-07-17 NOTE — Telephone Encounter (Signed)
Called pt to give PH's adv; pt states she has an appt c [facility] today; appreciative of the call.

## 2021-07-17 NOTE — ED Notes (Signed)
Present with vaginal bleeding. Stated it started two days ago. Started light and has picked up the last two days. Changing pads approx every 2 hrs. This is an increase as she said she was changing pad every 3 hrs. Tonight she stated she felt as though a tampon was stuck half way in her vaginal. She palpated and stated "something bolbus" hanging out.

## 2021-07-23 ENCOUNTER — Encounter
Admission: RE | Admit: 2021-07-23 | Discharge: 2021-07-23 | Disposition: A | Discharge status: Home / Self Care | Payer: No Typology Code available for payment source | Source: Ambulatory Visit | Attending: Obstetrics and Gynecology | Admitting: Obstetrics and Gynecology

## 2021-07-23 ENCOUNTER — Other Ambulatory Visit: Payer: Self-pay

## 2021-07-23 DIAGNOSIS — E118 Type 2 diabetes mellitus with unspecified complications: Secondary | ICD-10-CM | POA: Insufficient documentation

## 2021-07-23 DIAGNOSIS — Z01810 Encounter for preprocedural cardiovascular examination: Secondary | ICD-10-CM | POA: Diagnosis not present

## 2021-07-23 HISTORY — DX: Type 2 diabetes mellitus without complications: E11.9

## 2021-07-23 NOTE — Patient Instructions (Addendum)
Your procedure is scheduled on: Thursday, January 26 Report to the Registration Desk on the 1st floor of the [facility]. To find out your arrival time, please call [phone] between 1PM - 3PM on: Wednesday, January 25  REMEMBER: Instructions that are not followed completely may result in serious medical risk, up to and including death; or upon the discretion of your surgeon and anesthesiologist your surgery may need to be rescheduled.  Do not eat food after midnight the night before surgery.  No gum chewing, lozengers or hard candies.  You may however, drink CLEAR liquids up to 2 hours before you are scheduled to arrive for your surgery. Do not drink anything within 2 hours of your scheduled arrival time.  Clear liquids include: - water  - apple juice without pulp - gatorade (not RED, PURPLE, OR BLUE) - black coffee or tea (Do NOT add milk or creamers to the coffee or tea) Do NOT drink anything that is not on this list.  TAKE THESE MEDICATIONS THE MORNING OF SURGERY WITH A SIP OF WATER:  Alprazolam if needed for anxiety  One week prior to surgery: Stop Anti-inflammatories (NSAIDS) such as Advil, Aleve, Ibuprofen, Motrin, Naproxen, Naprosyn and Aspirin based products such as Excedrin, Goodys Powder, BC Powder. Stop ANY OVER THE COUNTER supplements until after surgery. You may however, continue to take Tylenol if needed for pain up until the day of surgery.  No Alcohol for 24 hours before or after surgery.  No Smoking including e-cigarettes for 24 hours prior to surgery.   On the morning of surgery brush your teeth with toothpaste and water, you may rinse your mouth with mouthwash if you wish. Do not swallow any toothpaste or mouthwash.  Do not wear jewelry, make-up, hairpins, clips or nail polish.  Do not wear lotions, powders, or perfumes.   Do not shave body from the neck down 48 hours prior to surgery just in case you cut yourself which could leave a site for  infection.   Contact lenses, hearing aids and dentures may not be worn into surgery.  Do not bring valuables to the hospital. [facility] is not responsible for any missing/lost belongings or valuables.   Notify your doctor if there is any change in your medical condition (cold, fever, infection).  Wear comfortable clothing (specific to your surgery type) to the hospital.  After surgery, you can help prevent lung complications by doing breathing exercises.  Take deep breaths and cough every 1-2 hours. Your doctor may order a device called an Incentive Spirometer to help you take deep breaths.  If you are being discharged the day of surgery, you will not be allowed to drive home. You will need a responsible adult (18 years or older) to drive you home and stay with you that night.   If you are taking public transportation, you will need to have a responsible adult (18 years or older) with you. Please confirm with your physician that it is acceptable to use public transportation.   Please call the [facility] Dept. at [phone] if you have any questions about these instructions.  Surgery Visitation Policy:  Patients undergoing a surgery or procedure may have one family member or support person with them as long as that person is not COVID-19 positive or experiencing its symptoms.  That person may remain in the waiting area during the procedure and may rotate out with other people.

## 2021-07-25 ENCOUNTER — Ambulatory Visit
Admission: RE | Admit: 2021-07-25 | Discharge: 2021-07-25 | Disposition: A | Discharge status: Home / Self Care | Payer: No Typology Code available for payment source | Attending: Obstetrics and Gynecology | Admitting: Obstetrics and Gynecology

## 2021-07-25 ENCOUNTER — Ambulatory Visit: Payer: No Typology Code available for payment source | Admitting: Anesthesiology

## 2021-07-25 ENCOUNTER — Other Ambulatory Visit: Payer: Self-pay

## 2021-07-25 ENCOUNTER — Encounter: Payer: Self-pay | Admitting: Obstetrics and Gynecology

## 2021-07-25 ENCOUNTER — Encounter
Admission: RE | Disposition: A | Discharge status: Home / Self Care | Payer: Self-pay | Source: Home / Self Care | Attending: Obstetrics and Gynecology

## 2021-07-25 DIAGNOSIS — N8502 Endometrial intraepithelial neoplasia [EIN]: Secondary | ICD-10-CM | POA: Insufficient documentation

## 2021-07-25 DIAGNOSIS — F419 Anxiety disorder, unspecified: Secondary | ICD-10-CM | POA: Diagnosis not present

## 2021-07-25 DIAGNOSIS — N9489 Other specified conditions associated with female genital organs and menstrual cycle: Secondary | ICD-10-CM | POA: Diagnosis not present

## 2021-07-25 DIAGNOSIS — E119 Type 2 diabetes mellitus without complications: Secondary | ICD-10-CM | POA: Insufficient documentation

## 2021-07-25 DIAGNOSIS — N858 Other specified noninflammatory disorders of uterus: Secondary | ICD-10-CM | POA: Diagnosis not present

## 2021-07-25 DIAGNOSIS — N950 Postmenopausal bleeding: Secondary | ICD-10-CM

## 2021-07-25 HISTORY — PX: HYSTEROSCOPY WITH D & C: SHX1775

## 2021-07-25 LAB — GLUCOSE, CAPILLARY
Glucose-Capillary: 140 mg/dL — ABNORMAL HIGH (ref 70–99)
Glucose-Capillary: 130 mg/dL — ABNORMAL HIGH (ref 70–99)

## 2021-07-25 SURGERY — DILATATION AND CURETTAGE /HYSTEROSCOPY
Anesthesia: General

## 2021-07-25 MED ORDER — SODIUM CHLORIDE 0.9 % IV SOLN: INTRAVENOUS | Status: DC

## 2021-07-25 MED ORDER — PROPOFOL 10 MG/ML IV BOLUS
INTRAVENOUS | Status: DC | PRN
Start: 2021-07-25 — End: 2021-07-25
  Administered 2021-07-25: 150 mg via INTRAVENOUS

## 2021-07-25 MED ORDER — FENTANYL CITRATE (PF) 100 MCG/2ML IJ SOLN
INTRAMUSCULAR | Status: AC
  Filled 2021-07-25: qty 2

## 2021-07-25 MED ORDER — SODIUM CHLORIDE 0.9 % IR SOLN
Status: DC | PRN
  Administered 2021-07-25: 1000 mL
  Administered 2021-07-25 (×2): 3000 mL

## 2021-07-25 MED ORDER — FENTANYL CITRATE (PF) 100 MCG/2ML IJ SOLN
INTRAMUSCULAR | Status: DC | PRN
  Administered 2021-07-25: 25 ug via INTRAVENOUS

## 2021-07-25 MED ORDER — MIDAZOLAM HCL 2 MG/2ML IJ SOLN
INTRAMUSCULAR | Status: DC | PRN
  Administered 2021-07-25: 2 mg via INTRAVENOUS

## 2021-07-25 MED ORDER — ACETAMINOPHEN 10 MG/ML IV SOLN
INTRAVENOUS | Status: DC | PRN
Start: 2021-07-25 — End: 2021-07-25
  Administered 2021-07-25: 1000 mg via INTRAVENOUS

## 2021-07-25 MED ORDER — FAMOTIDINE 20 MG PO TABS
ORAL_TABLET | ORAL | Status: AC
  Administered 2021-07-25: 20 mg via ORAL
  Filled 2021-07-25: qty 1

## 2021-07-25 MED ORDER — ONDANSETRON HCL 4 MG/2ML IJ SOLN
INTRAMUSCULAR | Status: AC
  Filled 2021-07-25: qty 2

## 2021-07-25 MED ORDER — CHLORHEXIDINE GLUCONATE 0.12 % MT SOLN: 15.0000 mL | Freq: Once | OROMUCOSAL | Status: AC

## 2021-07-25 MED ORDER — LACTATED RINGERS IV SOLN: INTRAVENOUS | Status: DC

## 2021-07-25 MED ORDER — SILVER NITRATE-POT NITRATE 75-25 % EX MISC
CUTANEOUS | Status: AC
  Filled 2021-07-25: qty 10

## 2021-07-25 MED ORDER — LIDOCAINE HCL (PF) 2 % IJ SOLN
INTRAMUSCULAR | Status: AC
  Filled 2021-07-25: qty 5

## 2021-07-25 MED ORDER — 0.9 % SODIUM CHLORIDE (POUR BTL) OPTIME
TOPICAL | Status: DC | PRN
  Administered 2021-07-25: 100 mL

## 2021-07-25 MED ORDER — ONDANSETRON HCL 4 MG/2ML IJ SOLN
INTRAMUSCULAR | Status: DC | PRN
  Administered 2021-07-25: 4 mg via INTRAVENOUS

## 2021-07-25 MED ORDER — MIDAZOLAM HCL 2 MG/2ML IJ SOLN
INTRAMUSCULAR | Status: AC
  Filled 2021-07-25: qty 2

## 2021-07-25 MED ORDER — ORAL CARE MOUTH RINSE: 15.0000 mL | Freq: Once | OROMUCOSAL | Status: AC

## 2021-07-25 MED ORDER — EPHEDRINE SULFATE (PRESSORS) 50 MG/ML IJ SOLN
INTRAMUSCULAR | Status: DC | PRN
  Administered 2021-07-25: 10 mg via INTRAVENOUS

## 2021-07-25 MED ORDER — PROPOFOL 10 MG/ML IV BOLUS
INTRAVENOUS | Status: AC
  Filled 2021-07-25: qty 20

## 2021-07-25 MED ORDER — CHLORHEXIDINE GLUCONATE 0.12 % MT SOLN
OROMUCOSAL | Status: AC
  Administered 2021-07-25: 15 mL via OROMUCOSAL
  Filled 2021-07-25: qty 15

## 2021-07-25 MED ORDER — FAMOTIDINE 20 MG PO TABS: 20.0000 mg | ORAL_TABLET | Freq: Once | ORAL | Status: AC

## 2021-07-25 MED ORDER — PHENYLEPHRINE HCL (PRESSORS) 10 MG/ML IV SOLN
INTRAVENOUS | Status: DC | PRN
  Administered 2021-07-25: 100 ug via INTRAVENOUS

## 2021-07-25 MED ORDER — IBUPROFEN 600 MG PO TABS: 600.0000 mg | ORAL_TABLET | Freq: Four times a day (QID) | ORAL | 0 refills | Status: DC

## 2021-07-25 MED ORDER — DEXAMETHASONE SODIUM PHOSPHATE 10 MG/ML IJ SOLN
INTRAMUSCULAR | Status: AC
  Filled 2021-07-25: qty 1

## 2021-07-25 MED ORDER — EPHEDRINE 5 MG/ML INJ
INTRAVENOUS | Status: AC
  Filled 2021-07-25: qty 5

## 2021-07-25 MED ORDER — DEXAMETHASONE SODIUM PHOSPHATE 10 MG/ML IJ SOLN
INTRAMUSCULAR | Status: DC | PRN
  Administered 2021-07-25: 5 mg via INTRAVENOUS

## 2021-07-25 MED ORDER — SILVER NITRATE-POT NITRATE 75-25 % EX MISC
CUTANEOUS | Status: DC | PRN
  Administered 2021-07-25: 2

## 2021-07-25 MED ORDER — LIDOCAINE-EPINEPHRINE 2 %-1:100000 IJ SOLN
INTRAMUSCULAR | Status: AC
  Filled 2021-07-25: qty 1

## 2021-07-25 MED ORDER — LIDOCAINE HCL (CARDIAC) PF 100 MG/5ML IV SOSY
PREFILLED_SYRINGE | INTRAVENOUS | Status: DC | PRN
  Administered 2021-07-25: 100 mg via INTRAVENOUS

## 2021-07-25 MED ORDER — ACETAMINOPHEN 10 MG/ML IV SOLN
INTRAVENOUS | Status: AC
  Filled 2021-07-25: qty 100

## 2021-07-25 SURGICAL SUPPLY — 32 items
BAG DRN RND TRDRP ANRFLXCHMBR (UROLOGICAL SUPPLIES)
BAG URINE DRAIN 2000ML AR STRL (UROLOGICAL SUPPLIES) IMPLANT
CATH FOLEY 2WAY  5CC 16FR (CATHETERS) ×1
CATH FOLEY 2WAY 5CC 16FR (CATHETERS) ×1
CATH URTH 16FR FL 2W BLN LF (CATHETERS) IMPLANT
DEVICE MYOSURE LITE (MISCELLANEOUS) ×2 IMPLANT
DEVICE MYOSURE REACH (MISCELLANEOUS) ×4 IMPLANT
DRSG TELFA 3X8 NADH (GAUZE/BANDAGES/DRESSINGS) IMPLANT
ELECT COAG BIPOLAR CYL 1.2MMM (ELECTROSURGICAL) ×2
ELECT REM PT RETURN 9FT ADLT (ELECTROSURGICAL) ×2
ELECTRODE COAG BIPLR CYL 1.2MM (ELECTROSURGICAL) IMPLANT
ELECTRODE REM PT RTRN 9FT ADLT (ELECTROSURGICAL) ×2 IMPLANT
GAUZE 4X4 16PLY ~~LOC~~+RFID DBL (SPONGE) ×4 IMPLANT
GLOVE SURG ENC MOIS LTX SZ7 (GLOVE) ×3 IMPLANT
GLOVE SURG UNDER POLY LF SZ7.5 (GLOVE) ×3 IMPLANT
GOWN STRL REUS W/ TWL LRG LVL3 (GOWN DISPOSABLE) ×4 IMPLANT
GOWN STRL REUS W/TWL LRG LVL3 (GOWN DISPOSABLE) ×4
IV NS IRRIG 3000ML ARTHROMATIC (IV SOLUTION) ×5 IMPLANT
KIT PROCEDURE FLUENT (KITS) ×3 IMPLANT
KIT TURNOVER CYSTO (KITS) ×3 IMPLANT
LOOP SUT CHROMIC 0 SGL3 (SUTURE) ×1 IMPLANT
MANIFOLD NEPTUNE II (INSTRUMENTS) ×3 IMPLANT
NS IRRIG 500ML POUR BTL (IV SOLUTION) ×1 IMPLANT
PACK DNC HYST (MISCELLANEOUS) ×3 IMPLANT
PAD DRESSING TELFA 3X8 NADH (GAUZE/BANDAGES/DRESSINGS) IMPLANT
PAD OB MATERNITY 4.3X12.25 (PERSONAL CARE ITEMS) ×3 IMPLANT
PAD PREP 24X41 OB/GYN DISP (PERSONAL CARE ITEMS) ×3 IMPLANT
SCRUB EXIDINE 4% CHG 4OZ (MISCELLANEOUS) ×3 IMPLANT
SEAL ROD LENS SCOPE MYOSURE (ABLATOR) ×3 IMPLANT
SET CYSTO W/LG BORE CLAMP LF (SET/KITS/TRAYS/PACK) IMPLANT
SURGILUBE 2OZ TUBE FLIPTOP (MISCELLANEOUS) ×2 IMPLANT
TUBING CONNECTING 10 (TUBING) ×3 IMPLANT

## 2021-07-25 NOTE — Anesthesia Preprocedure Evaluation (Signed)
Anesthesia Evaluation  Patient identified by MRN, date of birth, ID band Patient awake  General Assessment Comment:Patient does not see doctors regularly  Reviewed: Allergy & Precautions, NPO status , Patient's Chart, lab work & pertinent test results  History of Anesthesia Complications Negative for: history of anesthetic complications  Airway Mallampati: II  TM Distance: >3 FB Neck ROM: Full    Dental  (+) Edentulous Upper, Edentulous Lower   Pulmonary neg pulmonary ROS, neg sleep apnea, neg COPD, Patient abstained from smoking.Not current smoker,    Pulmonary exam normal breath sounds clear to auscultation       Cardiovascular Exercise Tolerance: Good METS: 5 - 7 Mets (-) hypertension(-) CAD and (-) Past MI (-) dysrhythmias  Rhythm:Regular Rate:Normal - Systolic murmurs    Neuro/Psych PSYCHIATRIC DISORDERS Anxiety negative neurological ROS     GI/Hepatic neg GERD  ,(+)     (-) substance abuse  ,   Endo/Other  diabetes  Renal/GU negative Renal ROS     Musculoskeletal   Abdominal (+) + obese,   Peds  Hematology   Anesthesia Other Findings Past Medical History: No date: Anxiety No date: Diabetes mellitus without complication (HCC)     Comment:  not started on medication yet  Reproductive/Obstetrics                            Anesthesia Physical Anesthesia Plan  ASA: 2  Anesthesia Plan: General   Post-op Pain Management: Ofirmev IV (intra-op) and Toradol IV (intra-op)   Induction: Intravenous  PONV Risk Score and Plan: 3 and Ondansetron and Dexamethasone  Airway Management Planned: LMA  Additional Equipment: None  Intra-op Plan:   Post-operative Plan: Extubation in OR  Informed Consent: I have reviewed the patients History and Physical, chart, labs and discussed the procedure including the risks, benefits and alternatives for the proposed anesthesia with the patient or  authorized representative who has indicated his/her understanding and acceptance.     Dental advisory given  Plan Discussed with: CRNA and Surgeon  Anesthesia Plan Comments: (Discussed risks of anesthesia with patient, including PONV, sore throat, lip/dental/eye damage. Rare risks discussed as well, such as cardiorespiratory and neurological sequelae, and allergic reactions. Discussed the role of CRNA in patient's perioperative care. Patient understands.)        Anesthesia Quick Evaluation

## 2021-07-25 NOTE — Anesthesia Postprocedure Evaluation (Signed)
Anesthesia Post Note  Patient: SALIMATOU SIMONE  Procedure(s) Performed: DILATATION AND CURETTAGE /HYSTEROSCOPY -REMOVAL OF CERVICAL/ UTERINE MASS  Patient location during evaluation: PACU Anesthesia Type: General Level of consciousness: awake and alert Pain management: pain level controlled Vital Signs Assessment: post-procedure vital signs reviewed and stable Respiratory status: spontaneous breathing, nonlabored ventilation, respiratory function stable and patient connected to nasal cannula oxygen Cardiovascular status: blood pressure returned to baseline and stable Postop Assessment: no apparent nausea or vomiting Anesthetic complications: no   No notable events documented.   Last Vitals:  Vitals:   07/25/21 1700 07/25/21 1714  BP: (!) 160/76 (!) 158/78  Pulse: 65 67  Resp: 13 11  Temp:  (!) 36.3 C  SpO2: 100% 98%    Last Pain:  Vitals:   07/25/21 1714  TempSrc:   PainSc: 0-No pain                 Precious Haws Sharnee Douglass

## 2021-07-25 NOTE — Op Note (Signed)
°  Operative Note    Name: Sue Calhoun  Date of Service: 07/25/2021  DOB: [birth year]  MRN: [id number]   Pre-Operative Diagnosis:  1) Endometrial mass [N94.89] 2) Postmenopausal bleeding [N95.0]  Post-Operative Diagnosis:  1) Endometrial mass [N94.89] 2) Postmenopausal bleeding [N95.0]  Procedures:  1. Hysteroscopy, dilation and curettage 2. Removal of uterine mass  Primary Surgeon: Prentice Docker, MD   EBL: 25 mL   IVF: 500 mL   Urine output: minimal at beginning of case  Specimens:  1) Endometrial masses 2) Endometrial curettings  Fluid deficit: low, but incalculable due to the use of two different types of scopes  Drains: none  Antibiotics: none  Anesthesia: General  Complications: None   Disposition: PACU   Condition: Stable   Findings:  1) normal appearing external anatomy and vaginal tissue 2) normal appearing cervix 3) No evidence of mass external to cervix 4) A left posterior uterine mass, well circumscribed 5) An irregular shaped, broad-based right lateral uterine mass  PROCEDURE IN DETAIL:  After informed consent was obtained, the patient was taken to the operating room where anesthesia was obtained without difficulty. The patient was positioned in the dorsal lithotomy position in [place].  The patient's bladder was catheterized with an in and out foley catheter.  The patient was examined under anesthesia, with the above noted findings.  The bi-valved speculum was placed inside the patient's vagina, and the the anterior lip of the cervix was grasped with the tenaculum.  The cervix was progressively dilated to a 7 mm Hegar dilator.  The hysteroscope was introduced, with the above noted findings.  The MyoSure scope was utilized to resect the endometrial masses, which occurred without difficulty.  The hysteroscope was removed and a gentle curettage was performed with the serrated curette.  The [facility] resectoscope (able to be used in an isotonic  medium) was utilized to obtain hemostasis at the resection sites.    Hemostasis was noted, and all instruments were removed, with continued hemostasis noted throughout.  Silver nitrate was applied to the cervix at the tenaculum entry sites to obtain hemostasis.  She was then taken out of dorsal lithotomy.  The patient tolerated the procedure well.  Sponge, lap and needle counts were correct x2.  The patient was taken to recovery room in excellent condition.  Will Bonnet, MD, [place] 07/25/2021 4:37 PM

## 2021-07-25 NOTE — H&P (Addendum)
Preoperative History and Physical  Sue Calhoun is a 71 y.o. [id number] here for surgical management of uterine/cervical mass.   No significant preoperative concerns.  History of Present Illness: 71 y.o. female who has a history of postmenopausal bleeding in 2020, presents from an ER follow up for vaginal bleeding. She started having progressively heavier bleeding since Sunday. She started passing little clots Tuesday. Mid-day yesterday she noted a sensation like she was "wearing a tampon." She felt into her vaginal and felt what was like a "rubber ball." She thought her uterus had dropped and was trying to come out. She went to the ER and was there overnight. She had essentially normal blood counts and an enlarged endometrial lining on ultrasound. A growth was noted on pelvic exam.   She has a history of postmenopausal bleeding. In 2020 she was scheduled to go to the ER due to equivocal findings. She states that she became very sick and had to cancel. Her bleeding stopped. So, she never follow up.   Proposed surgery: Hysteroscopy, dilation and curettage, removal of cervical/uterine mass  Past Medical History:  Diagnosis Date   Anxiety    Diabetes mellitus without complication ([place])    not started on medication yet   Past Surgical History:  Procedure Laterality Date   NO PAST SURGERIES     OB History  Gravida Para Term Preterm AB Living  5 1 1   4 1   SAB IAB Ectopic Multiple Live Births  4       1    # Outcome Date GA Lbr Len/2nd Weight Sex Delivery Anes PTL Lv  5 Term           4 SAB           3 SAB           2 SAB           1 SAB           Patient denies any other pertinent gynecologic issues.   No current facility-administered medications on file prior to encounter.   Current Outpatient Medications on File Prior to Encounter  Medication Sig Dispense Refill   ALPRAZolam (XANAX) 1 MG tablet Take 1 mg by mouth 3 (three) times daily as needed for anxiety.     Spirulina 500 MG TABS  Take 500 mg by mouth daily.     No Known Allergies  Social History:   reports that she has never smoked. She has never used smokeless tobacco. She reports that she does not currently use alcohol. She reports that she does not use drugs.  Family History  Problem Relation Age of Onset   Cancer Mother 45       thyroid cancer   Skin cancer Sister    Breast cancer Maternal Grandmother 58    Review of Systems: Noncontributory  PHYSICAL EXAM: Blood pressure (!) 159/89, pulse 79, temperature 98.2 F (36.8 C), temperature source Oral, resp. rate 18, height 5' 5.75" (1.67 m), weight 103 kg, SpO2 99 %. CONSTITUTIONAL: Well-developed, well-nourished female in no acute distress.  HENT:  Normocephalic, atraumatic, External right and left ear normal. Oropharynx is clear and moist EYES: Conjunctivae and EOM are normal. Pupils are equal, round, and reactive to light. No scleral icterus.  NECK: Normal range of motion, supple, no masses SKIN: Skin is warm and dry. No rash noted. Not diaphoretic. No erythema. No pallor. [place]: Alert and oriented to person, place, and time. Normal reflexes, muscle tone coordination.  No cranial nerve deficit noted. PSYCHIATRIC: Normal mood and affect. Normal behavior. Normal judgment and thought content. CARDIOVASCULAR: Normal heart rate noted, regular rhythm RESPIRATORY: Effort and breath sounds normal, no problems with respiration noted ABDOMEN: Soft, nontender, nondistended. Genitourinary Comments: There is a dark-red/brown mass extending from the cervix to the just outside the introitus. The mass is mobile and I could not find any areas to where it might be fixed in the vagina. I was unable to get a finger around the mass to definitively appreciate the cervix. Otherwise, an internal exam was prohibitive due to the mass and body habitus.  MUSCULOSKELETAL: Normal range of motion. No edema and no tenderness. 2+ distal pulses.  Labs: Results for orders placed or  performed during the hospital encounter of 07/25/21 (from the past 336 hour(s))  Glucose, capillary   Collection Time: 07/25/21  2:31 PM  Result Value Ref Range   Glucose-Capillary 140 (H) 70 - 99 mg/dL  Results for orders placed or performed during the hospital encounter of 07/17/21 (from the past 336 hour(s))  CBC with Differential   Collection Time: 07/17/21 12:43 AM  Result Value Ref Range   WBC 12.0 (H) 4.0 - 10.5 K/uL   RBC 4.26 3.87 - 5.11 MIL/uL   Hemoglobin 13.4 12.0 - 15.0 g/dL   HCT 40.1 36.0 - 46.0 %   MCV 94.1 80.0 - 100.0 fL   MCH 31.5 26.0 - 34.0 pg   MCHC 33.4 30.0 - 36.0 g/dL   RDW 12.8 11.5 - 15.5 %   Platelets 352 150 - 400 K/uL   nRBC 0.0 0.0 - 0.2 %   Neutrophils Relative % 52 %   Neutro Abs 6.3 1.7 - 7.7 K/uL   Lymphocytes Relative 40 %   Lymphs Abs 4.7 (H) 0.7 - 4.0 K/uL   Monocytes Relative 6 %   Monocytes Absolute 0.7 0.1 - 1.0 K/uL   Eosinophils Relative 1 %   Eosinophils Absolute 0.2 0.0 - 0.5 K/uL   Basophils Relative 1 %   Basophils Absolute 0.1 0.0 - 0.1 K/uL   Immature Granulocytes 0 %   Abs Immature Granulocytes 0.03 0.00 - 0.07 K/uL  Basic metabolic panel   Collection Time: 07/17/21 12:43 AM  Result Value Ref Range   Sodium 136 135 - 145 mmol/L   Potassium 3.7 3.5 - 5.1 mmol/L   Chloride 103 98 - 111 mmol/L   CO2 27 22 - 32 mmol/L   Glucose, Bld 156 (H) 70 - 99 mg/dL   BUN 17 8 - 23 mg/dL   Creatinine, Ser 0.71 0.44 - 1.00 mg/dL   Calcium 8.9 8.9 - 10.3 mg/dL   GFR, Estimated >60 >60 mL/min   Anion gap 6 5 - 15    Imaging Studies: US PELVIC COMPLETE WITH TRANSVAGINAL  Result Date: 07/17/2021 CLINICAL DATA:  71 year old female with 2 days of postmenopausal vaginal bleeding. EXAM: TRANSABDOMINAL AND TRANSVAGINAL ULTRASOUND OF PELVIS TECHNIQUE: Both transabdominal and transvaginal ultrasound examinations of the pelvis were performed. Transabdominal technique was performed for global imaging of the pelvis including uterus, ovaries, adnexal  regions, and pelvic cul-de-sac. It was necessary to proceed with endovaginal exam following the transabdominal exam to visualize the endometrium. COMPARISON:  Transvaginal ultrasound 07/22/2018. FINDINGS: Uterus Measurements: 6.9 x 5.3 x 4.8 cm = volume: 93 mL. Retroverted. No myometrial mass identified. Endometrium Thickness: 25 - 28 mm. Thickened, heterogeneous, and indistinct (image 50) with areas of hypovascular cystic change and/or debris in the lower uterine segment (image 45) superimposed  on abnormally hypervascular endometrial tissue near the fundus. The endometrium was also abnormal in 2020. Right ovary Measurements: Not identified despite transabdominal and transvaginal attempts. Left ovary Measurements: Not identified despite transabdominal and transvaginal attempts. Other findings No pelvic free fluid. IMPRESSION: 1. Abnormally thickened, heterogeneous, and hypervascular endometrium. Endometrial sampling is indicated to exclude carcinoma. AJR 2008; 177:L39-03). 2. Neither ovary could be visualized.  No pelvic free fluid. Electronically Signed   By: Genevie Ann M.D.   On: 07/17/2021 04:51    Biopsy results from last week still pending.  Assessment: Patient Active Problem List   Diagnosis Date Noted   Endometrial mass 07/22/2018   Postmenopausal bleeding 07/01/2018    Plan: Patient will undergo surgical management with above procedure.   The risks of surgery were discussed in detail with the patient including but not limited to: bleeding which may require transfusion or reoperation; infection which may require antibiotics; injury to surrounding organs which may involve bowel, bladder, ureters ; need for additional procedures including laparoscopy or laparotomy; thromboembolic phenomenon, surgical site problems and other postoperative/anesthesia complications. Likelihood of success in alleviating the patient's condition was discussed. Routine postoperative instructions will be reviewed with the  patient and her family in detail after surgery.  The patient concurred with the proposed plan, giving informed written consent for the surgery.  Patient has been NPO since last night she will remain NPO for procedure.  Anesthesia and OR aware.  Preoperative prophylactic antibiotics, as indicated, and SCDs ordered on call to the OR.  To OR when ready.  Prentice Docker, MD 07/25/2021 3:12 PM

## 2021-07-25 NOTE — Anesthesia Procedure Notes (Signed)
Procedure Name: LMA Insertion Date/Time: 07/25/2021 3:19 PM Performed by: Lia Foyer, CRNA Pre-anesthesia Checklist: Patient identified, Emergency Drugs available, Suction available and Patient being monitored Patient Re-evaluated:Patient Re-evaluated prior to induction Oxygen Delivery Method: Circle system utilized Preoxygenation: Pre-oxygenation with 100% oxygen Induction Type: IV induction Ventilation: Mask ventilation without difficulty LMA: LMA flexible inserted LMA Size: 3.5 Tube type: Oral Number of attempts: 1 Airway Equipment and Method: Oral airway Placement Confirmation: positive ETCO2 and breath sounds checked- equal and bilateral Tube secured with: Tape Dental Injury: Teeth and Oropharynx as per pre-operative assessment

## 2021-07-25 NOTE — Transfer of Care (Signed)
Immediate Anesthesia Transfer of Care Note  Patient: Sue Calhoun  Procedure(s) Performed: Procedure(s): DILATATION AND CURETTAGE /HYSTEROSCOPY -REMOVAL OF CERVICAL/ UTERINE MASS (N/A)  Patient Location: PACU  Anesthesia Type:General  Level of Consciousness: sedated  Airway & Oxygen Therapy: Patient Spontanous Breathing and Patient connected to face mask oxygen  Post-op Assessment: Report given to RN and Post -op Vital signs reviewed and stable  Post vital signs: Reviewed and stable  Last Vitals:  Vitals:   07/25/21 1454 07/25/21 1640  BP: (!) 159/89 (!) 153/78  Pulse: 79 71  Resp: 18 14  Temp: 36.8 C   SpO2: 72% 072%    Complications: No apparent anesthesia complications

## 2021-07-25 NOTE — Discharge Instructions (Signed)
AMBULATORY SURGERY  ?DISCHARGE INSTRUCTIONS ? ? ?The drugs that you were given will stay in your system until tomorrow so for the next 24 hours you should not: ? ?Drive an automobile ?Make any legal decisions ?Drink any alcoholic beverage ? ? ?You may resume regular meals tomorrow.  Today it is better to start with liquids and gradually work up to solid foods. ? ?You may eat anything you prefer, but it is better to start with liquids, then soup and crackers, and gradually work up to solid foods. ? ? ?Please notify your doctor immediately if you have any unusual bleeding, trouble breathing, redness and pain at the surgery site, drainage, fever, or pain not relieved by medication. ? ? ? ?Additional Instructions: ? ? ? ?Please contact your physician with any problems or Same Day Surgery at [phone], Monday through Friday 6 am to 4 pm, or [facility] [place] [facility] number at [phone].  ?

## 2021-07-26 ENCOUNTER — Encounter: Payer: Self-pay | Admitting: Obstetrics and Gynecology

## 2021-07-30 LAB — SURGICAL PATHOLOGY

## 2021-08-01 ENCOUNTER — Ambulatory Visit: Payer: PPO | Admitting: Obstetrics & Gynecology

## 2021-08-07 ENCOUNTER — Encounter: Payer: Self-pay | Admitting: Obstetrics and Gynecology

## 2021-08-08 ENCOUNTER — Encounter: Payer: Self-pay | Admitting: *Deleted

## 2021-08-14 ENCOUNTER — Other Ambulatory Visit: Payer: Self-pay

## 2021-08-14 ENCOUNTER — Inpatient Hospital Stay: Payer: No Typology Code available for payment source

## 2021-08-14 ENCOUNTER — Inpatient Hospital Stay
Payer: No Typology Code available for payment source | Attending: Obstetrics and Gynecology | Admitting: Obstetrics and Gynecology

## 2021-08-14 ENCOUNTER — Encounter: Payer: Self-pay | Admitting: Obstetrics and Gynecology

## 2021-08-14 VITALS — BP 143/83 | HR 69 | Temp 98.0°F | Resp 18 | Wt 229.0 lb

## 2021-08-14 DIAGNOSIS — F419 Anxiety disorder, unspecified: Secondary | ICD-10-CM | POA: Insufficient documentation

## 2021-08-14 DIAGNOSIS — L918 Other hypertrophic disorders of the skin: Secondary | ICD-10-CM | POA: Insufficient documentation

## 2021-08-14 DIAGNOSIS — E669 Obesity, unspecified: Secondary | ICD-10-CM | POA: Diagnosis not present

## 2021-08-14 DIAGNOSIS — Z7189 Other specified counseling: Secondary | ICD-10-CM | POA: Diagnosis not present

## 2021-08-14 DIAGNOSIS — Z780 Asymptomatic menopausal state: Secondary | ICD-10-CM | POA: Insufficient documentation

## 2021-08-14 DIAGNOSIS — E119 Type 2 diabetes mellitus without complications: Secondary | ICD-10-CM | POA: Insufficient documentation

## 2021-08-14 DIAGNOSIS — R32 Unspecified urinary incontinence: Secondary | ICD-10-CM | POA: Diagnosis not present

## 2021-08-14 DIAGNOSIS — N9489 Other specified conditions associated with female genital organs and menstrual cycle: Secondary | ICD-10-CM

## 2021-08-14 DIAGNOSIS — F32A Depression, unspecified: Secondary | ICD-10-CM | POA: Insufficient documentation

## 2021-08-14 DIAGNOSIS — Z6837 Body mass index (BMI) 37.0-37.9, adult: Secondary | ICD-10-CM | POA: Diagnosis not present

## 2021-08-14 DIAGNOSIS — Z79899 Other long term (current) drug therapy: Secondary | ICD-10-CM | POA: Insufficient documentation

## 2021-08-14 DIAGNOSIS — R9431 Abnormal electrocardiogram [ECG] [EKG]: Secondary | ICD-10-CM

## 2021-08-14 DIAGNOSIS — N8502 Endometrial intraepithelial neoplasia [EIN]: Secondary | ICD-10-CM

## 2021-08-14 LAB — URINALYSIS, COMPLETE (UACMP) WITH MICROSCOPIC
Bilirubin Urine: NEGATIVE
Glucose, UA: NEGATIVE mg/dL
Hgb urine dipstick: NEGATIVE
Ketones, ur: NEGATIVE mg/dL
Nitrite: NEGATIVE
Protein, ur: NEGATIVE mg/dL
Specific Gravity, Urine: 1.019 (ref 1.005–1.030)
pH: 7 (ref 5.0–8.0)

## 2021-08-14 LAB — HEMOGLOBIN A1C
Hgb A1c MFr Bld: 6.9 % — ABNORMAL HIGH (ref 4.8–5.6)
Mean Plasma Glucose: 151.33 mg/dL

## 2021-08-14 NOTE — Patient Instructions (Signed)
You will be scheduled for a pre-admission testing appointment. We will call you with the appointment. It will be a telephone visit.          DIVISION OF GYNECOLOGIC ONCOLOGY BOWEL PREP   The following instructions are extremely important to prepare for your surgery. Please follow them carefully   Step 1: Liquid Diet Instructions              Clear Liquid Diet for GYN Oncology Patients Day Before Surgery The day before your scheduled surgery DO NOT EAT any solid foods.  We do want you to drink enough liquids, but NO MILK products.  We do not want you to be dehydrated.  Clear liquids are defined as no milk products and no pieces of any solid food. Drink at least 64 oz. of fluid.  The following are all approved for you to drink the day before you surgery. Chicken, Beef or Vegetable Broth (bouillon or consomm) - NO BROTH AFTER MIDNIGHT Plain Jello  (no fruit) Water Strained lemonade or fruit punch Gatorade (any flavor) CLEAR Ensure or Boost Breeze Fruit juices without pulp, such as apple, grape, or cranberry juice Clear sodas - NO SODA AFTER MIDNIGHT Ice Pops without bits of fruit or fruit pulp Honey Tea or coffee without milk or cream                 Any foods not on the above list should be avoided         If you are given an Ensure from pre-admit testing Do drink this as directed                                                                                      Step 2: Laxatives           The evening before surgery:   Time: around 5pm   Follow these instructions carefully.   Administer 1 Dulcolax suppository according to manufacturer instructions on the box. You will need to purchase this laxative at a pharmacy or grocery store.    Individual responses to laxatives vary; this prep may cause multiple bowel movements. It often works in 30 minutes and may take as long as 3 hours. Stay near an available bathroom.   If you are given a fleets enema by Pre-admit testing you may  use this instead  DO NOT USE BOTH   It is important to stay hydrated. Ensure you are still drinking clear liquids.     IMPORTANT: FOR YOUR SAFETY, WE WILL HAVE TO CANCEL YOUR SURGERY IF YOU DO NOT FOLLOW THESE INSTRUCTIONS.   Do not eat anything after midnight (including gum or candy) prior to your surgery. Avoid drinking carbonated beverages after midnight. You can have clear liquids up until one hour before you arrive at the hospital. Nothing by mouth means no liquids, gum, candy, etc for one hour before your arrival time.      Laparoscopy Laparoscopy is a procedure to diagnose diseases in the abdomen. During the procedure, a thin, lighted, pencil-sized instrument called a laparoscope is inserted into the abdomen through an incision. The laparoscope allows your health care provider to  look at the organs inside your body. LET [facility] CARE PROVIDER KNOW ABOUT: Any allergies you have. All medicines you are taking, including vitamins, herbs, eye drops, creams, and over-the-counter medicines. Previous problems you or members of your family have had with the use of anesthetics. Any blood disorders you have. Previous surgeries you have had. Medical conditions you have. RISKS AND COMPLICATIONS  Generally, this is a safe procedure. However, problems can occur, which may include: Infection. Bleeding. Damage to other organs. Allergic reaction to the anesthetics used during the procedure. BEFORE THE PROCEDURE Do not eat or drink anything after midnight on the night before the procedure or as directed by your health care provider. Ask your health care provider about: Changing or stopping your regular medicines. Taking medicines such as aspirin and ibuprofen. These medicines can thin your blood. Do not take these medicines before your procedure if your health care provider instructs you not to. Plan to have someone take you home after the procedure. PROCEDURE You may be given a medicine  to help you relax (sedative). You will be given a medicine to make you sleep (general anesthetic). Your abdomen will be inflated with a gas. This will make your organs easier to see. Small incisions will be made in your abdomen. A laparoscope and other small instruments will be inserted into the abdomen through the incisions. A tissue sample may be removed from an organ in the abdomen for examination. The instruments will be removed from the abdomen. The gas will be released. The incisions will be closed with stitches (sutures). AFTER THE PROCEDURE  Your blood pressure, heart rate, breathing rate, and blood oxygen level will be monitored often until the medicines you were given have worn off.   This information is not intended to replace advice given to you by your health care provider. Make sure you discuss any questions you have with your health care provider.                                             Bowel Symptoms After Surgery After gynecologic surgery, women often have temporary changes in bowel function (constipation and gas pain).  Following are tips to help prevent and treat common bowel problems.  It also tells you when to call the doctor.  This is important because some symptoms might be a sign of a more serious bowel problem such as obstruction (bowel blockage).  These problems are rare but can happen after gynecologic surgery.   Besides surgery, what can temporarily affect bowel function? 1. Dietary changes   2. Decreased physical activity   3.Antibiotics   4. Pain medication   How can I prevent constipation (three days or more without a stool)? Include fiber in your diet: whole grains, raw or dried fruits & vegetables, prunes, prune/pear juiceDrink at least 8 glasses of liquid (preferably water) every day Avoid: Gas forming foods such as broccoli, beans, peas, salads, cabbage, sweet potatoes Greasy, fatty, or fried foods Activity helps bowel function return to normal,  walk around the house at least 3-4 times each day for 15 minutes or longer, if tolerated.  Rocking in a rocking chair is preferable to sitting still. Stool softeners: these are not laxatives, but serve to soften the stool to avoid straining.  Take 2-4 times a day until normal bowel function returns  Examples: Colace or generic equivalent (Docusate) Bulk laxatives: provide a concentrated source of fiber.  They do not stimulate the bowel.  Take 1-2 times each day until normal bowel function return.              Examples: Citrucel, Metamucil, Fiberal, Fibercon   What can I take for Gas Pains? Simethicone (Mylicon, Gas-X, Maalox-Gas, Mylanta-Gas) take 3-4 times a day Maalox Regular - take 3-4 times a day Mylanta Regular - take 3-4 times a day   What can I take if I become constipated? Start with stool softeners and add additional laxatives below as needed to have a bowel movement every 1-2 days  Stool softeners 1-2 tablets, 2 times a day Senokot 1-2 tablets, 1-2 times a day Glycerin suppository can soften hard stool take once a day Bisacodyl suppository once a day  Milk of Magnesia 30 mL 1-2 times a day Fleets or tap water enema    What can I do for nausea?  Limit most solid foods for 24-48 hours Continue eating small frequent amounts of liquids and/or bland soft foods Toast, crackers, cooked cereal (grits, cream of wheat, rice) Benadryl: a mild anti-nausea medicine can be obtained without a prescription. May cause drowsiness, especially if taken with narcotic pain medicines Contact provider for prescription nausea medication     What can I do, or take for diarrhea (more than five loose stools per day)? Drink plenty of clear fluids to prevent dehydration May take Kaopectate, Pepto-Bismol, Imodium, or probiotics for 1-2 days Anusol or Preparation-H can be helpful for hemorrhoids and irritated tissue around anus   When should I call the doctor?             CONSTIPATION:  Not  relieved after three days following the above program VOMITING: That contains blood, coffee ground material More the three times/hour and unable to keep down nausea medication for more than eight hours With dry mouth, dark or strong urine, feeling light-headed, dizzy, or confused With severe abdominal pain or bloating for more than 24 hours DIARRHEA: That continues for more then 24-48 hours despite treatment That contains blood or tarry material With dry mouth, dark or strong urine, feeling light~headed, dizzy, or confused FEVER: 101 F or higher along with nausea, vomiting, gas pain, diarrhea UNABLE TO: Pass gas from rectum for more than 24 hours Tolerate liquids by mouth for more than 24 hours        Laparoscopic Hysterectomy, Care After Refer to this sheet in the next few weeks. These instructions provide you with information on caring for yourself after your procedure. Your health care provider may also give you more specific instructions. Your treatment has been planned according to current medical practices, but problems sometimes occur. Call your health care provider if you have any problems or questions after your procedure. What can I expect after the procedure? Pain and bruising at the incision sites. You will be given pain medicine to control it. Menopausal symptoms such as hot flashes, night sweats, and insomnia if your ovaries were removed. Sore throat from the breathing tube that was inserted during surgery. Follow these instructions at home: Only take over-the-counter or prescription medicines for pain, discomfort, or fever as directed by your health care provider. Do not take aspirin. It can cause bleeding. Do not drive when taking pain medicine. Follow your health care provider's advice regarding diet, exercise, lifting, driving, and general activities. Resume your usual diet as directed and allowed. Get plenty of rest and sleep.  Do not douche, use tampons, or have  sexual intercourse for at least 6 weeks, or until your health care provider gives you permission. Change your bandages (dressings) as directed by your health care provider. Monitor your temperature and notify your health care provider of a fever. Take showers instead of baths for 2-3 weeks. Do not drink alcohol until your health care provider gives you permission. If you develop constipation, you may take a mild laxative with your health care provider's permission. Bran foods may help with constipation problems. Drinking enough fluids to keep your urine clear or pale yellow may help as well. Try to have someone home with you for 1-2 weeks to help around the house. Keep all of your follow-up appointments as directed by your health care provider. Contact a health care provider if: You have swelling, redness, or increasing pain around your incision sites. You have pus coming from your incision. You notice a bad smell coming from your incision. Your incision breaks open. You feel dizzy or lightheaded. You have pain or bleeding when you urinate. You have persistent diarrhea. You have persistent nausea and vomiting. You have abnormal vaginal discharge. You have a rash. You have any type of abnormal reaction or develop an allergy to your medicine. You have poor pain control with your prescribed medicine. Get help right away if: You have chest pain or shortness of breath. You have severe abdominal pain that is not relieved with pain medicine. You have pain or swelling in your legs. This information is not intended to replace advice given to you by your health care provider. Make sure you discuss any questions you have with your health care provider. Document Released: 04/06/2013 Document Revised: 11/22/2015 Document Reviewed: 01/04/2013 Elsevier Interactive Patient Education  2017 [employer].

## 2021-08-14 NOTE — H&P (View-Only) (Signed)
Gynecologic Oncology Consult Visit   Referring Provider: Prentice Docker, MD  Chief Concern: EIN  Subjective:  Sue Calhoun is a 71 y.o. [phone] female who is seen in consultation from Dr. Glennon Mac for EIN.  She initially presented with history of postmenopausal bleeding in 2020 and went to the ER for evaluation. Ultrasound noted enlarged endometrial lining on ultrasound. A growth was noted on pelvic exam. She was seen by Dr. Glennon Mac who described a ". . . dark-red/brown mass extending from the cervix to the just outside the introitus. The mass is mobile and I could not find any areas to where it might be fixed in the vagina. I was unable to get a finger around the mass to definitively appreciate the cervix. Otherwise, an internal exam was prohibitive due to the mass and body habitus."   07/17/2021 Biopsy of the mass in clinic revealed  BENIGN ENDOMETRIAL  POLYP WITH BREAKDOWN CHANGES. NO HYPERPLASIA OR CARCINOMA.  07/25/2021 she underwent hysteroscopy, dilation and curettage, and Myosure. The previously seen cervical/uterine mass was no longer present.   DIAGNOSIS:  A. 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   B. 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  She presents today for evaluation and discussion of management.    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   HbA1c = 8 05/19/21    Problem List: Patient Active Problem List   Diagnosis Date Noted   Endometrial mass 07/22/2018   Postmenopausal bleeding 07/01/2018   Skin lesion of right lower extremity 07/01/2018   Anxiety 11/17/2017   Depression 11/17/2017   Class 2 obesity due to excess calories without serious comorbidity with body mass index (BMI) of 37.0 to 37.9 in adult  12/09/2012   Foot pain 06/10/2012    Past Medical History: Past Medical History:  Diagnosis Date   Anxiety    Diabetes mellitus without complication ([place])    not started on medication yet   Endometrial mass     Past Surgical History: Past Surgical History:  Procedure Laterality Date   HYSTEROSCOPY WITH D & C N/A 07/25/2021   Procedure: DILATATION AND CURETTAGE /HYSTEROSCOPY -REMOVAL OF CERVICAL/ UTERINE MASS;  Surgeon: Will Bonnet, MD;  Location: [facility] ORS;  Service: Gynecology;  Laterality: N/A;   NO PAST SURGERIES      Past Gynecologic History:  Menarche: age 67.  Post menopausal Hx of Chlamydia in April 1988.    OB History:  OB History  Gravida Para Term Preterm AB Living  6 1 1   5 1   SAB IAB Ectopic Multiple Live Births  5       1    # Outcome Date GA Lbr Len/2nd Weight Sex Delivery Anes PTL Lv  6 SAB           5 Term           4 SAB           3 SAB           2 SAB           1 SAB             Obstetric Comments  1 vaginal delivery    Family History: Family History  Problem Relation Age of Onset   Diabetes Mother    Cancer Mother 76  thyroid cancer   Diabetes Father    Diabetes Sister    Skin cancer Sister    Diabetes Sister    Diabetes Sister    Diabetes Sister    Diabetes Sister    Diabetes Brother    Breast cancer Maternal Grandmother 74    Social History: Social History   Socioeconomic History   Marital status: Single    Spouse name: Not on file   Number of children: 1   Years of education: Not on file   Highest education level: Not on file  Occupational History   Not on file  Tobacco Use   Smoking status: Never   Smokeless tobacco: Never  Vaping Use   Vaping Use: Never used  Substance and Sexual Activity   Alcohol use: Not Currently   Drug use: Never   Sexual activity: Not Currently    Birth control/protection: Post-menopausal  Other Topics Concern   Not on file  Social History Narrative   Lives with son. 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 Helps to take care of her 5 sisters, all of whom are disabled.    Social Determinants of Health   Financial Resource Strain: Not on file  Food Insecurity: Not on file  Transportation Needs: Not on file  Physical Activity: Not on file  Stress: Not on file  Social Connections: Not on file  Intimate Partner Violence: Not on file    Allergies: No Known Allergies  Current Medications: Current Outpatient Medications  Medication Sig Dispense Refill   ALPRAZolam (XANAX) 1 MG tablet Take 1 mg by mouth 3 (three) times daily as needed for anxiety.     Spirulina 500 MG TABS Take 500 mg by mouth daily.     ibuprofen (ADVIL) 600 MG tablet Take 1 tablet (600 mg total) by mouth every 6 (six) hours. (Patient not taking: Reported on 08/14/2021) 30 tablet 0   No current facility-administered medications for this visit.    Review of Systems General: negative for fevers, changes in weight or night sweats Skin: negative for changes in moles or sores or rash Eyes: negative for changes in vision HEENT: negative for change in hearing, tinnitus, voice changes Pulmonary: negative for dyspnea, orthopnea, productive cough, wheezing Cardiac: negative for palpitations, pain Gastrointestinal: negative for nausea, vomiting, constipation, diarrhea, hematemesis, hematochezia Genitourinary/Sexual: negative for dysuria, retention, hematuria. Positive for incontinence Ob/Gyn:  positive for postmenopausal bleeding Musculoskeletal: negative for pain, joint pain, back pain Hematology: negative for easy bruising, abnormal bleeding Neurologic/Psych: negative for headaches, seizures, paralysis, weakness, numbness  Objective:  Physical Examination:  BP (!) 143/83 (BP Location: Left Arm, Patient Position: Sitting)    Pulse 69    Temp 98 F (36.7 C) (Tympanic)    Resp 18    Wt 229 lb (103.9 kg)    SpO2 98%    BMI 37.24 kg/m   ECOG Performance Status: 0 -  Asymptomatic  GENERAL: Patient is a well appearing female in no acute distress HEENT:  PERRL, neck supple with midline trachea. Thyroid without masses.  NODES:  No cervical, supraclavicular, axillary, or inguinal lymphadenopathy palpated.  LUNGS:  Clear to auscultation bilaterally.  No wheezes or rhonchi. HEART:  Regular rate and rhythm. No murmur appreciated. ABDOMEN:  Soft, nontender.  Positive, normoactive bowel sounds.  MSK:  No focal spinal tenderness to palpation. Full range of motion bilaterally in the upper extremities. EXTREMITIES:  No peripheral edema.   SKIN:  Clear with no obvious rashes or skin changes.  No nail dyscrasia. NEURO:  Nonfocal. Well oriented.  Appropriate affect.  Pelvic chaperoned by CMA. Vulva: normal appearing vulva with no masses, tenderness or lesions; Vagina: normal vagina, no discharge, exudate, lesion, or erythema Adnexa: normal adnexa in size, nontender and no masses; Uterus: uterus is normal size, shape, consistency and nontender; Cervix: no cervical motion tenderness; erythema on the upper aspect but no lesions. Rectal: not indicated. Large skin tag 2 cm on the right buttock.    Lab Review Labs on site today:        Radiologic Imaging: 07/17/2021 TRANSABDOMINAL AND TRANSVAGINAL ULTRASOUND OF PELVIS  FINDINGS: Uterus   Measurements: 6.9 x 5.3 x 4.8 cm = volume: 93 mL. Retroverted. No myometrial mass identified.   Endometrium   Thickness: 25 - 28 mm. Thickened, heterogeneous, and indistinct (image 50) with areas of hypovascular cystic change and/or debris in the lower uterine segment (image 45)  superimposed on abnormally hypervascular endometrial tissue near the fundus. The endometrium was also abnormal in 2020.   Right ovary   Measurements: Not identified despite transabdominal and transvaginal attempts.   Left ovary   Measurements: Not identified despite transabdominal and transvaginal attempts.   Other findings   No pelvic free  fluid.   IMPRESSION: 1. Abnormally thickened, heterogeneous, and hypervascular endometrium. Endometrial sampling is indicated to exclude carcinoma. AJR 2008; 841:[id number]). 2. Neither ovary could be visualized.  No pelvic free fluid.  ECG     Assessment:  Sue Calhoun is a 71 y.o. female diagnosed with EIN.   Symptomatic skin tag.   DM with HbA1c=8  Possible septal infarct based on ECG, asymptomatic  Urinary incontinence, unspecified  Medical co-morbidities complicating care: Diabetes. Body mass index is 37.24 kg/m.  Plan:   Problem List Items Addressed This Visit       Genitourinary   Endometrial mass - Primary   Relevant Orders   Hemoglobin A1c   Urinalysis, Complete w Microscopic   Urine Culture   Ambulatory referral to Urogynecology   Other Visit Diagnoses     EIN (endometrial intraepithelial neoplasia)       Relevant Orders   Urinalysis, Complete w Microscopic   Urine Culture   Ambulatory referral to Urogynecology   Counseling and coordination of care       Urinary incontinence, unspecified type       Relevant Orders   Urinalysis, Complete w Microscopic   Urine Culture   Ambulatory referral to Urogynecology   Nonspecific abnormal electrocardiogram (ECG) (EKG)           We discussed options for management including surgery and hormonal therapy. She would like to proceed with surgery. Plan for robotic TH, BSO, SLN injection/mapping/biopsy if frozen section reveals malignancy; skin tag removal with Dr. Glennon Mac on 09/04/2021.   Risks were discussed in detail. These include infection, anesthesia, bleeding, transfusion, wound separation, vaginal cuff dehiscence, medical issues (blood clots, stroke, heart attack, fluid in the lungs, pneumonia, abnormal heart rhythm, death), possible exploratory surgery with larger incision, lymphedema, lymphocyst, allergic reaction, injury to adjacent organs (bowel, bladder, blood vessels, nerves, ureters, uterus)    Suggested  return to clinic in  4-6 weeks after surgery.   The patient's diagnosis, an outline of the further diagnostic and laboratory studies which will be required, the recommendation, and alternatives were discussed.  All questions were answered to the patient's satisfaction.  GYN VTE Prophylaxis:   Risk point calculation: +2   age 75-74 +2   BMI >30 TOTAL: 4   VTE  Risk assessment:  This patient has cancer, elevated tumor markers or personal/family hx VTE, and does not have disseminated cancer, or planned open or radical surgery.  The calculated risk score is 0-4.  High Risk:   One pre-op dose UFH, then continuous post-op dosing with LMWH or UFH while inpatient AND SCDs.    Improve glycemic control. Check HbA1c  She will need anesthesia clearance. Possible septal infarct based on ECG, asymptomatic. She can walk up a flight of stairs (12-20) before getting short of breath. She does not have chest pain at rest or with activities.   Urinary incontinence, unspecified. No prior work up and she may have a stress component. Plan for UA and culture today. Referral to Dr. Wannetta Sender, Urogynecology for consideration of a joint procedure.   A total of 80 minutes were spent with the patient/family today; >50% was spent in education, counseling and coordination of care for EIN and skin tag.  I personally had a face to face interaction and evaluated the patient jointly with the NP, Ms. Beckey Rutter.  I have reviewed her history and available records and have performed the key portions of the physical exam including lymph node survey, abdominal exam, pelvic exam with my findings confirming those documented above by the APP.  I have discussed the case with the APP and the patient.  I agree with the above documentation, assessment and plan which was fully formulated by me.  Counseling was completed by me.   I personally saw the patient and performed a substantive portion of this encounter in conjunction with the  listed APP as documented above.  Kamiyah Kindel Gaetana Michaelis, MD     CC:  Will Bonnet, MD [address] [place],  [place] [address] [phone]

## 2021-08-14 NOTE — H&P (Signed)
Gynecologic Oncology Consult Visit   Referring Provider: Prentice Docker, MD  Chief Concern: EIN  Subjective:  Sue Calhoun is a 71 y.o. [phone] female who is seen in consultation from Dr. Glennon Mac for EIN.  She initially presented with history of postmenopausal bleeding in 2020 and went to the ER for evaluation. Ultrasound noted enlarged endometrial lining on ultrasound. A growth was noted on pelvic exam. She was seen by Dr. Glennon Mac who described a ". . . dark-red/brown mass extending from the cervix to the just outside the introitus. The mass is mobile and I could not find any areas to where it might be fixed in the vagina. I was unable to get a finger around the mass to definitively appreciate the cervix. Otherwise, an internal exam was prohibitive due to the mass and body habitus."   07/17/2021 Biopsy of the mass in clinic revealed  BENIGN ENDOMETRIAL  POLYP WITH BREAKDOWN CHANGES. NO HYPERPLASIA OR CARCINOMA.  07/25/2021 she underwent hysteroscopy, dilation and curettage, and Myosure. The previously seen cervical/uterine mass was no longer present.   DIAGNOSIS:  A. 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   B. 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  She presents today for evaluation and discussion of management.    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   HbA1c = 8 05/19/21    Problem List: Patient Active Problem List   Diagnosis Date Noted   Endometrial mass 07/22/2018   Postmenopausal bleeding 07/01/2018   Skin lesion of right lower extremity 07/01/2018   Anxiety 11/17/2017   Depression 11/17/2017   Class 2 obesity due to excess calories without serious comorbidity with body mass index (BMI) of 37.0 to 37.9 in adult  12/09/2012   Foot pain 06/10/2012    Past Medical History: Past Medical History:  Diagnosis Date   Anxiety    Diabetes mellitus without complication ([place])    not started on medication yet   Endometrial mass     Past Surgical History: Past Surgical History:  Procedure Laterality Date   HYSTEROSCOPY WITH D & C N/A 07/25/2021   Procedure: DILATATION AND CURETTAGE /HYSTEROSCOPY -REMOVAL OF CERVICAL/ UTERINE MASS;  Surgeon: Will Bonnet, MD;  Location: [facility] ORS;  Service: Gynecology;  Laterality: N/A;   NO PAST SURGERIES      Past Gynecologic History:  Menarche: age 57.  Post menopausal Hx of Chlamydia in April 1988.    OB History:  OB History  Gravida Para Term Preterm AB Living  6 1 1   5 1   SAB IAB Ectopic Multiple Live Births  5       1    # Outcome Date GA Lbr Len/2nd Weight Sex Delivery Anes PTL Lv  6 SAB           5 Term           4 SAB           3 SAB           2 SAB           1 SAB             Obstetric Comments  1 vaginal delivery    Family History: Family History  Problem Relation Age of Onset   Diabetes Mother    Cancer Mother 61  thyroid cancer   Diabetes Father    Diabetes Sister    Skin cancer Sister    Diabetes Sister    Diabetes Sister    Diabetes Sister    Diabetes Sister    Diabetes Brother    Breast cancer Maternal Grandmother 45    Social History: Social History   Socioeconomic History   Marital status: Single    Spouse name: Not on file   Number of children: 1   Years of education: Not on file   Highest education level: Not on file  Occupational History   Not on file  Tobacco Use   Smoking status: Never   Smokeless tobacco: Never  Vaping Use   Vaping Use: Never used  Substance and Sexual Activity   Alcohol use: Not Currently   Drug use: Never   Sexual activity: Not Currently    Birth control/protection: Post-menopausal  Other Topics Concern   Not on file  Social History Narrative   Lives with son. 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 Helps to take care of her 5 sisters, all of whom are disabled.    Social Determinants of Health   Financial Resource Strain: Not on file  Food Insecurity: Not on file  Transportation Needs: Not on file  Physical Activity: Not on file  Stress: Not on file  Social Connections: Not on file  Intimate Partner Violence: Not on file    Allergies: No Known Allergies  Current Medications: Current Outpatient Medications  Medication Sig Dispense Refill   ALPRAZolam (XANAX) 1 MG tablet Take 1 mg by mouth 3 (three) times daily as needed for anxiety.     Spirulina 500 MG TABS Take 500 mg by mouth daily.     ibuprofen (ADVIL) 600 MG tablet Take 1 tablet (600 mg total) by mouth every 6 (six) hours. (Patient not taking: Reported on 08/14/2021) 30 tablet 0   No current facility-administered medications for this visit.    Review of Systems General: negative for fevers, changes in weight or night sweats Skin: negative for changes in moles or sores or rash Eyes: negative for changes in vision HEENT: negative for change in hearing, tinnitus, voice changes Pulmonary: negative for dyspnea, orthopnea, productive cough, wheezing Cardiac: negative for palpitations, pain Gastrointestinal: negative for nausea, vomiting, constipation, diarrhea, hematemesis, hematochezia Genitourinary/Sexual: negative for dysuria, retention, hematuria. Positive for incontinence Ob/Gyn:  positive for postmenopausal bleeding Musculoskeletal: negative for pain, joint pain, back pain Hematology: negative for easy bruising, abnormal bleeding Neurologic/Psych: negative for headaches, seizures, paralysis, weakness, numbness  Objective:  Physical Examination:  BP (!) 143/83 (BP Location: Left Arm, Patient Position: Sitting)    Pulse 69    Temp 98 F (36.7 C) (Tympanic)    Resp 18    Wt 229 lb (103.9 kg)    SpO2 98%    BMI 37.24 kg/m   ECOG Performance Status: 0 -  Asymptomatic  GENERAL: Patient is a well appearing female in no acute distress HEENT:  PERRL, neck supple with midline trachea. Thyroid without masses.  NODES:  No cervical, supraclavicular, axillary, or inguinal lymphadenopathy palpated.  LUNGS:  Clear to auscultation bilaterally.  No wheezes or rhonchi. HEART:  Regular rate and rhythm. No murmur appreciated. ABDOMEN:  Soft, nontender.  Positive, normoactive bowel sounds.  MSK:  No focal spinal tenderness to palpation. Full range of motion bilaterally in the upper extremities. EXTREMITIES:  No peripheral edema.   SKIN:  Clear with no obvious rashes or skin changes.  No nail dyscrasia. NEURO:  Nonfocal. Well oriented.  Appropriate affect.  Pelvic chaperoned by CMA. Vulva: normal appearing vulva with no masses, tenderness or lesions; Vagina: normal vagina, no discharge, exudate, lesion, or erythema Adnexa: normal adnexa in size, nontender and no masses; Uterus: uterus is normal size, shape, consistency and nontender; Cervix: no cervical motion tenderness; erythema on the upper aspect but no lesions. Rectal: not indicated. Large skin tag 2 cm on the right buttock.    Lab Review Labs on site today        Radiologic Imaging: 07/17/2021 TRANSABDOMINAL AND TRANSVAGINAL ULTRASOUND OF PELVIS  FINDINGS: Uterus   Measurements: 6.9 x 5.3 x 4.8 cm = volume: 93 mL. Retroverted. No myometrial mass identified.   Endometrium   Thickness: 25 - 28 mm. Thickened, heterogeneous, and indistinct (image 50) with areas of hypovascular cystic change and/or debris in the lower uterine segment (image 45)  superimposed on abnormally hypervascular endometrial tissue near the fundus. The endometrium was also abnormal in 2020.   Right ovary   Measurements: Not identified despite transabdominal and transvaginal attempts.   Left ovary   Measurements: Not identified despite transabdominal and transvaginal attempts.   Other findings   No pelvic free  fluid.   IMPRESSION: 1. Abnormally thickened, heterogeneous, and hypervascular endometrium. Endometrial sampling is indicated to exclude carcinoma. AJR 2008; 683:M19-62). 2. Neither ovary could be visualized.  No pelvic free fluid.  ECG     Assessment:  Sue Calhoun is a 71 y.o. female diagnosed with EIN.   Symptomatic skin tag.   DM with HbA1c=8  Possible septal infarct based on ECG, asymptomatic  Urinary incontinence, unspecified  Medical co-morbidities complicating care: Diabetes. Body mass index is 37.24 kg/m.  Plan:   Problem List Items Addressed This Visit       Genitourinary   Endometrial mass - Primary   Relevant Orders   Hemoglobin A1c   Urinalysis, Complete w Microscopic   Urine Culture   Ambulatory referral to Urogynecology   Other Visit Diagnoses     EIN (endometrial intraepithelial neoplasia)       Relevant Orders   Urinalysis, Complete w Microscopic   Urine Culture   Ambulatory referral to Urogynecology   Counseling and coordination of care       Urinary incontinence, unspecified type       Relevant Orders   Urinalysis, Complete w Microscopic   Urine Culture   Ambulatory referral to Urogynecology   Nonspecific abnormal electrocardiogram (ECG) (EKG)           We discussed options for management including surgery and hormonal therapy. She would like to proceed with surgery. Plan for robotic TH, BSO, SLN injection/mapping/biopsy if frozen section reveals malignancy; skin tag removal with Dr. Glennon Mac on 09/04/2021.   Risks were discussed in detail. These include infection, anesthesia, bleeding, transfusion, wound separation, vaginal cuff dehiscence, medical issues (blood clots, stroke, heart attack, fluid in the lungs, pneumonia, abnormal heart rhythm, death), possible exploratory surgery with larger incision, lymphedema, lymphocyst, allergic reaction, injury to adjacent organs (bowel, bladder, blood vessels, nerves, ureters, uterus)    Suggested  return to clinic in  4-6 weeks after surgery.   The patient's diagnosis, an outline of the further diagnostic and laboratory studies which will be required, the recommendation, and alternatives were discussed.  All questions were answered to the patient's satisfaction.  GYN VTE Prophylaxis:   Risk point calculation: +2   age 50-74 +2   BMI >30 TOTAL: 4   VTE  Risk assessment:  This patient has cancer, elevated tumor markers or personal/family hx VTE, and does not have disseminated cancer, or planned open or radical surgery.  The calculated risk score is 0-4.  High Risk:   One pre-op dose UFH, then continuous post-op dosing with LMWH or UFH while inpatient AND SCDs.    Improve glycemic control. Check HbA1c  She will need anesthesia clearance. Possible septal infarct based on ECG, asymptomatic. She can walk up a flight of stairs (12-20) before getting short of breath. She does not have chest pain at rest or with activities.   Urinary incontinence, unspecified. No prior work up and she may have a stress component. Plan for UA and culture today. Referral to Dr. Wannetta Sender, Urogynecology for consideration of a joint procedure.   A total of 80 minutes were spent with the patient/family today; >50% was spent in education, counseling and coordination of care for EIN and skin tag.  I personally had a face to face interaction and evaluated the patient jointly with the NP, Ms. Beckey Rutter.  I have reviewed her history and available records and have performed the key portions of the physical exam including lymph node survey, abdominal exam, pelvic exam with my findings confirming those documented above by the APP.  I have discussed the case with the APP and the patient.  I agree with the above documentation, assessment and plan which was fully formulated by me.  Counseling was completed by me.   I personally saw the patient and performed a substantive portion of this encounter in conjunction with the  listed APP as documented above.  Hally Colella Gaetana Michaelis, MD     CC:  Will Bonnet, MD [address] [place],  [place] [address] [phone]

## 2021-08-14 NOTE — Progress Notes (Signed)
Pre and post operative teaching completed. Provided copy of teaching in AVS. Encouraged to call with any questions.

## 2021-08-14 NOTE — Progress Notes (Signed)
Gynecologic Oncology Consult Visit   Referring Provider: Prentice Docker, MD  Chief Concern: EIN  Subjective:  Sue Calhoun is a 71 y.o. [phone] female who is seen in consultation from Dr. Glennon Mac for EIN.  She initially presented with history of postmenopausal bleeding in 2020 and went to the ER for evaluation. Ultrasound noted enlarged endometrial lining on ultrasound. A growth was noted on pelvic exam. She was seen by Dr. Glennon Mac who described a ". . . dark-red/brown mass extending from the cervix to the just outside the introitus. The mass is mobile and I could not find any areas to where it might be fixed in the vagina. I was unable to get a finger around the mass to definitively appreciate the cervix. Otherwise, an internal exam was prohibitive due to the mass and body habitus."   07/17/2021 Biopsy of the mass in clinic revealed  BENIGN ENDOMETRIAL  POLYP WITH BREAKDOWN CHANGES. NO HYPERPLASIA OR CARCINOMA.  07/25/2021 she underwent hysteroscopy, dilation and curettage, and Myosure. The previously seen cervical/uterine mass was no longer present.   DIAGNOSIS:  A. 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   B. 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  She presents today for evaluation and discussion of management.    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   HbA1c = 8 05/19/21    Problem List: Patient Active Problem List   Diagnosis Date Noted   Endometrial mass 07/22/2018   Postmenopausal bleeding 07/01/2018   Skin lesion of right lower extremity 07/01/2018   Anxiety 11/17/2017   Depression 11/17/2017   Class 2 obesity due to excess calories without serious comorbidity with body mass index (BMI) of 37.0 to 37.9 in adult  12/09/2012   Foot pain 06/10/2012    Past Medical History: Past Medical History:  Diagnosis Date   Anxiety    Diabetes mellitus without complication ([place])    not started on medication yet   Endometrial mass     Past Surgical History: Past Surgical History:  Procedure Laterality Date   HYSTEROSCOPY WITH D & C N/A 07/25/2021   Procedure: DILATATION AND CURETTAGE /HYSTEROSCOPY -REMOVAL OF CERVICAL/ UTERINE MASS;  Surgeon: Will Bonnet, MD;  Location: [facility] ORS;  Service: Gynecology;  Laterality: N/A;   NO PAST SURGERIES      Past Gynecologic History:  Menarche: age 21.  Post menopausal Hx of Chlamydia in April 1988.    OB History:  OB History  Gravida Para Term Preterm AB Living  6 1 1   5 1   SAB IAB Ectopic Multiple Live Births  5       1    # Outcome Date GA Lbr Len/2nd Weight Sex Delivery Anes PTL Lv  6 SAB           5 Term           4 SAB           3 SAB           2 SAB           1 SAB             Obstetric Comments  1 vaginal delivery    Family History: Family History  Problem Relation Age of Onset   Diabetes Mother    Cancer Mother 106  thyroid cancer   Diabetes Father    Diabetes Sister    Skin cancer Sister    Diabetes Sister    Diabetes Sister    Diabetes Sister    Diabetes Sister    Diabetes Brother    Breast cancer Maternal Grandmother 10    Social History: Social History   Socioeconomic History   Marital status: Single    Spouse name: Not on file   Number of children: 1   Years of education: Not on file   Highest education level: Not on file  Occupational History   Not on file  Tobacco Use   Smoking status: Never   Smokeless tobacco: Never  Vaping Use   Vaping Use: Never used  Substance and Sexual Activity   Alcohol use: Not Currently   Drug use: Never   Sexual activity: Not Currently    Birth control/protection: Post-menopausal  Other Topics Concern   Not on file  Social History Narrative   Lives with son. 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 Helps to take care of her 5 sisters, all of whom are disabled.    Social Determinants of Health   Financial Resource Strain: Not on file  Food Insecurity: Not on file  Transportation Needs: Not on file  Physical Activity: Not on file  Stress: Not on file  Social Connections: Not on file  Intimate Partner Violence: Not on file    Allergies: No Known Allergies  Current Medications: Current Outpatient Medications  Medication Sig Dispense Refill   ALPRAZolam (XANAX) 1 MG tablet Take 1 mg by mouth 3 (three) times daily as needed for anxiety.     Spirulina 500 MG TABS Take 500 mg by mouth daily.     ibuprofen (ADVIL) 600 MG tablet Take 1 tablet (600 mg total) by mouth every 6 (six) hours. (Patient not taking: Reported on 08/14/2021) 30 tablet 0   No current facility-administered medications for this visit.    Review of Systems General: negative for fevers, changes in weight or night sweats Skin: negative for changes in moles or sores or rash Eyes: negative for changes in vision HEENT: negative for change in hearing, tinnitus, voice changes Pulmonary: negative for dyspnea, orthopnea, productive cough, wheezing Cardiac: negative for palpitations, pain Gastrointestinal: negative for nausea, vomiting, constipation, diarrhea, hematemesis, hematochezia Genitourinary/Sexual: negative for dysuria, retention, hematuria. Positive for incontinence Ob/Gyn:  positive for postmenopausal bleeding Musculoskeletal: negative for pain, joint pain, back pain Hematology: negative for easy bruising, abnormal bleeding Neurologic/Psych: negative for headaches, seizures, paralysis, weakness, numbness  Objective:  Physical Examination:  BP (!) 143/83 (BP Location: Left Arm, Patient Position: Sitting)    Pulse 69    Temp 98 F (36.7 C) (Tympanic)    Resp 18    Wt 229 lb (103.9 kg)    SpO2 98%    BMI 37.24 kg/m   ECOG Performance Status: 0 -  Asymptomatic  GENERAL: Patient is a well appearing female in no acute distress HEENT:  PERRL, neck supple with midline trachea. Thyroid without masses.  NODES:  No cervical, supraclavicular, axillary, or inguinal lymphadenopathy palpated.  LUNGS:  Clear to auscultation bilaterally.  No wheezes or rhonchi. HEART:  Regular rate and rhythm. No murmur appreciated. ABDOMEN:  Soft, nontender.  Positive, normoactive bowel sounds.  MSK:  No focal spinal tenderness to palpation. Full range of motion bilaterally in the upper extremities. EXTREMITIES:  No peripheral edema.   SKIN:  Clear with no obvious rashes or skin changes.  No nail dyscrasia. NEURO:  Nonfocal. Well oriented.  Appropriate affect.  Pelvic chaperoned by CMA. Vulva: normal appearing vulva with no masses, tenderness or lesions; Vagina: normal vagina, no discharge, exudate, lesion, or erythema Adnexa: normal adnexa in size, nontender and no masses; Uterus: uterus is normal size, shape, consistency and nontender; Cervix: no cervical motion tenderness; erythema on the upper aspect but no lesions. Rectal: not indicated. Large skin tag 2 cm on the right buttock.    Lab Review Labs on site today:        Radiologic Imaging: 07/17/2021 TRANSABDOMINAL AND TRANSVAGINAL ULTRASOUND OF PELVIS  FINDINGS: Uterus   Measurements: 6.9 x 5.3 x 4.8 cm = volume: 93 mL. Retroverted. No myometrial mass identified.   Endometrium   Thickness: 25 - 28 mm. Thickened, heterogeneous, and indistinct (image 50) with areas of hypovascular cystic change and/or debris in the lower uterine segment (image 45)  superimposed on abnormally hypervascular endometrial tissue near the fundus. The endometrium was also abnormal in 2020.   Right ovary   Measurements: Not identified despite transabdominal and transvaginal attempts.   Left ovary   Measurements: Not identified despite transabdominal and transvaginal attempts.   Other findings   No pelvic free  fluid.   IMPRESSION: 1. Abnormally thickened, heterogeneous, and hypervascular endometrium. Endometrial sampling is indicated to exclude carcinoma. AJR 2008; 161:W96-04). 2. Neither ovary could be visualized.  No pelvic free fluid.  ECG     Assessment:  Sue Calhoun is a 71 y.o. female diagnosed with EIN.   Symptomatic skin tag.   DM with HbA1c=8  Possible septal infarct based on ECG, asymptomatic  Urinary incontinence, unspecified  Medical co-morbidities complicating care: Diabetes. Body mass index is 37.24 kg/m.  Plan:   Problem List Items Addressed This Visit       Genitourinary   Endometrial mass - Primary   Relevant Orders   Hemoglobin A1c   Urinalysis, Complete w Microscopic   Urine Culture   Ambulatory referral to Urogynecology   Other Visit Diagnoses     EIN (endometrial intraepithelial neoplasia)       Relevant Orders   Urinalysis, Complete w Microscopic   Urine Culture   Ambulatory referral to Urogynecology   Counseling and coordination of care       Urinary incontinence, unspecified type       Relevant Orders   Urinalysis, Complete w Microscopic   Urine Culture   Ambulatory referral to Urogynecology   Nonspecific abnormal electrocardiogram (ECG) (EKG)           We discussed options for management including surgery and hormonal therapy. She would like to proceed with surgery. Plan for robotic TH, BSO, SLN injection/mapping/biopsy if frozen section reveals malignancy; skin tag removal with Dr. Glennon Mac on 09/04/2021.   Risks were discussed in detail. These include infection, anesthesia, bleeding, transfusion, wound separation, vaginal cuff dehiscence, medical issues (blood clots, stroke, heart attack, fluid in the lungs, pneumonia, abnormal heart rhythm, death), possible exploratory surgery with larger incision, lymphedema, lymphocyst, allergic reaction, injury to adjacent organs (bowel, bladder, blood vessels, nerves, ureters, uterus)    Suggested  return to clinic in  4-6 weeks after surgery.   The patient's diagnosis, an outline of the further diagnostic and laboratory studies which will be required, the recommendation, and alternatives were discussed.  All questions were answered to the patient's satisfaction.  GYN VTE Prophylaxis:   Risk point calculation: +2   age 98-74 +2   BMI >30 TOTAL: 4   VTE  Risk assessment:  This patient has cancer, elevated tumor markers or personal/family hx VTE, and does not have disseminated cancer, or planned open or radical surgery.  The calculated risk score is 0-4.  High Risk:   One pre-op dose UFH, then continuous post-op dosing with LMWH or UFH while inpatient AND SCDs.    Improve glycemic control. Check HbA1c  She will need anesthesia clearance. Possible septal infarct based on ECG, asymptomatic. She can walk up a flight of stairs (12-20) before getting short of breath. She does not have chest pain at rest or with activities.   Urinary incontinence, unspecified. No prior work up and she may have a stress component. Plan for UA and culture today. Referral to Dr. Wannetta Sender, Urogynecology for consideration of a joint procedure.   A total of 80 minutes were spent with the patient/family today; >50% was spent in education, counseling and coordination of care for EIN and skin tag.  I personally had a face to face interaction and evaluated the patient jointly with the NP, Ms. Beckey Rutter.  I have reviewed her history and available records and have performed the key portions of the physical exam including lymph node survey, abdominal exam, pelvic exam with my findings confirming those documented above by the APP.  I have discussed the case with the APP and the patient.  I agree with the above documentation, assessment and plan which was fully formulated by me.  Counseling was completed by me.   I personally saw the patient and performed a substantive portion of this encounter in conjunction with the  listed APP as documented above.  Aunika Kirsten Gaetana Michaelis, MD     CC:  Will Bonnet, MD [address] [place],  [place] [address] [phone]

## 2021-08-15 ENCOUNTER — Telehealth: Payer: Self-pay

## 2021-08-15 NOTE — Telephone Encounter (Signed)
Called and notified Sue Calhoun with her pre-admit testing appointment details. Provided Hgb A1C results. She has been cleared for surgery by Dr. Theora Gianotti. Encouraged to call with any further questions.

## 2021-08-16 ENCOUNTER — Other Ambulatory Visit: Payer: Self-pay | Admitting: Nurse Practitioner

## 2021-08-16 DIAGNOSIS — N390 Urinary tract infection, site not specified: Secondary | ICD-10-CM

## 2021-08-16 MED ORDER — NITROFURANTOIN MONOHYD MACRO 100 MG PO CAPS: 100.0000 mg | ORAL_CAPSULE | Freq: Two times a day (BID) | ORAL | 0 refills | Status: AC

## 2021-08-16 NOTE — Progress Notes (Signed)
Urine culture consistent with UTI. Prescription sent to pharmacy. Voicemail left for patient.

## 2021-08-17 LAB — URINE CULTURE: Culture: >=100000 — AB

## 2021-08-22 ENCOUNTER — Other Ambulatory Visit: Payer: Self-pay

## 2021-08-22 ENCOUNTER — Encounter: Payer: Self-pay | Admitting: Obstetrics and Gynecology

## 2021-08-22 ENCOUNTER — Ambulatory Visit (INDEPENDENT_AMBULATORY_CARE_PROVIDER_SITE_OTHER): Payer: No Typology Code available for payment source | Admitting: Obstetrics and Gynecology

## 2021-08-22 VITALS — BP 131/84 | HR 62 | Wt 229.0 lb

## 2021-08-22 DIAGNOSIS — N816 Rectocele: Secondary | ICD-10-CM

## 2021-08-22 DIAGNOSIS — N3281 Overactive bladder: Secondary | ICD-10-CM

## 2021-08-22 DIAGNOSIS — N812 Incomplete uterovaginal prolapse: Secondary | ICD-10-CM

## 2021-08-22 DIAGNOSIS — R350 Frequency of micturition: Secondary | ICD-10-CM

## 2021-08-22 DIAGNOSIS — R159 Full incontinence of feces: Secondary | ICD-10-CM

## 2021-08-22 DIAGNOSIS — N393 Stress incontinence (female) (male): Secondary | ICD-10-CM | POA: Diagnosis not present

## 2021-08-22 LAB — POCT URINALYSIS DIPSTICK
Bilirubin, UA: NEGATIVE
Glucose, UA: NEGATIVE
Ketones, UA: NEGATIVE
Leukocytes, UA: NEGATIVE
Nitrite, UA: NEGATIVE
Protein, UA: POSITIVE — AB
Spec Grav, UA: 1.025 (ref 1.010–1.025)
Urobilinogen, UA: 0.2 E.U./dL
pH, UA: 7 (ref 5.0–8.0)
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
## 2021-08-22 NOTE — Patient Instructions (Signed)
°  Accidental Bowel Leakage: Our goal is to achieve formed bowel movements daily or every-other-day without leakage.  You may need to try different combinations of the following options to find what works best for you.  Some management options include: Dietary changes (more leafy greens, vegetables and fruits; less processed foods) Fiber supplementation (Metamucil or something with psyllium as active ingredient) Over-the-counter imodium (tablets or liquid) to help solidify the stool and prevent leakage of stool.

## 2021-08-26 ENCOUNTER — Encounter
Admission: RE | Admit: 2021-08-26 | Discharge: 2021-08-26 | Disposition: A | Discharge status: Home / Self Care | Payer: No Typology Code available for payment source | Source: Ambulatory Visit | Attending: Obstetrics and Gynecology | Admitting: Obstetrics and Gynecology

## 2021-08-26 ENCOUNTER — Other Ambulatory Visit: Payer: Self-pay

## 2021-08-26 NOTE — Patient Instructions (Addendum)
Your procedure is scheduled on: 09/04/21 Report to [place]. To find out your arrival time please call [phone] between 1PM - 3PM on 09/03/21.  Remember: Instructions that are not followed completely may result in serious medical risk, up to and including death, or upon the discretion of your surgeon and anesthesiologist your surgery may need to be rescheduled.     _X__ 1. Diet as instructed by Dr Theora Gianotti  Drink the G2 Gatorade  1 hour before arrival the day of surgery  __X__2.  On the morning of surgery brush your teeth with toothpaste and water, you                 may rinse your mouth with mouthwash if you wish.  Do not swallow any              toothpaste of mouthwash.     _X__ 3.  No Alcohol for 24 hours before or after surgery.   _X__ 4.  Do Not Smoke or use e-cigarettes For 24 Hours Prior to Your Surgery.                 Do not use any chewable tobacco products for at least 6 hours prior to                 surgery.  ____  5.  Bring all medications with you on the day of surgery if instructed.   __X__  6.  Notify your doctor if there is any change in your medical condition      (cold, fever, infections).     Do not wear jewelry, make-up, hairpins, clips or nail polish. Do not wear lotions, powders, or perfumes.  Do not shave 48 hours prior to surgery. Men may shave face and neck. Do not bring valuables to the hospital.    [facility] is not responsible for any belongings or valuables.  Contacts, dentures/partials or body piercings may not be worn into surgery. Bring a case for your contacts, glasses or hearing aids, a denture cup will be supplied. Leave your suitcase in the car. After surgery it may be brought to your room. For patients admitted to the hospital, discharge time is determined by your treatment team.   Patients discharged the day of surgery will not be allowed to drive home.   Please read over the  following fact sheets that you were given:   CHG soap  __X__ Take these medicines the morning of surgery with A SIP OF WATER:    1. none  2.   3.   4.  5.  6.  ____ Fleet Enema (as directed)   __X__ Use CHG Soap/SAGE wipes as directed  ____ Use inhalers on the day of surgery  ____ Stop metformin/Janumet/Farxiga 2 days prior to surgery    ____ Take 1/2 of usual insulin dose the night before surgery. No insulin the morning          of surgery.   ____ Stop Blood Thinners Coumadin/Plavix/Xarelto/Pleta/Pradaxa/Eliquis/Effient/Aspirin  on   Or contact your Surgeon, Cardiologist or Medical Doctor regarding  ability to stop your blood thinners  __X__ Stop Anti-inflammatories 7 days before surgery such as Advil, Ibuprofen, Motrin,  BC or Goodies Powder, Naprosyn, Naproxen, Aleve, Aspirin    __X__ Stop all herbals and supplements, fish oil or vitamins for 7 days     ____ Bring C-Pap to the hospital.

## 2021-08-29 ENCOUNTER — Telehealth: Payer: Self-pay

## 2021-08-29 ENCOUNTER — Other Ambulatory Visit: Payer: Self-pay

## 2021-08-29 NOTE — Telephone Encounter (Signed)
No auth required by this plan for [id number] Per Banks ? ?

## 2021-09-04 ENCOUNTER — Ambulatory Visit: Payer: No Typology Code available for payment source | Admitting: Certified Registered"

## 2021-09-04 ENCOUNTER — Encounter
Admission: RE | Disposition: A | Discharge status: Home / Self Care | Payer: Self-pay | Source: Home / Self Care | Attending: Obstetrics and Gynecology

## 2021-09-04 ENCOUNTER — Encounter: Payer: Self-pay | Admitting: Obstetrics and Gynecology

## 2021-09-04 ENCOUNTER — Other Ambulatory Visit: Payer: Self-pay

## 2021-09-04 ENCOUNTER — Ambulatory Visit
Admission: RE | Admit: 2021-09-04 | Discharge: 2021-09-04 | Disposition: A | Discharge status: Home / Self Care | Payer: No Typology Code available for payment source | Attending: Obstetrics and Gynecology | Admitting: Obstetrics and Gynecology

## 2021-09-04 DIAGNOSIS — N9489 Other specified conditions associated with female genital organs and menstrual cycle: Secondary | ICD-10-CM

## 2021-09-04 DIAGNOSIS — D271 Benign neoplasm of left ovary: Secondary | ICD-10-CM | POA: Diagnosis not present

## 2021-09-04 DIAGNOSIS — F419 Anxiety disorder, unspecified: Secondary | ICD-10-CM | POA: Insufficient documentation

## 2021-09-04 DIAGNOSIS — L918 Other hypertrophic disorders of the skin: Secondary | ICD-10-CM

## 2021-09-04 DIAGNOSIS — N736 Female pelvic peritoneal adhesions (postinfective): Secondary | ICD-10-CM | POA: Insufficient documentation

## 2021-09-04 DIAGNOSIS — C541 Malignant neoplasm of endometrium: Secondary | ICD-10-CM | POA: Diagnosis not present

## 2021-09-04 DIAGNOSIS — N8003 Adenomyosis of the uterus: Secondary | ICD-10-CM | POA: Insufficient documentation

## 2021-09-04 DIAGNOSIS — N8000 Endometriosis of the uterus, unspecified: Secondary | ICD-10-CM | POA: Insufficient documentation

## 2021-09-04 DIAGNOSIS — N8502 Endometrial intraepithelial neoplasia [EIN]: Secondary | ICD-10-CM | POA: Diagnosis present

## 2021-09-04 DIAGNOSIS — C181 Malignant neoplasm of appendix: Secondary | ICD-10-CM

## 2021-09-04 DIAGNOSIS — K388 Other specified diseases of appendix: Secondary | ICD-10-CM | POA: Diagnosis not present

## 2021-09-04 DIAGNOSIS — E669 Obesity, unspecified: Secondary | ICD-10-CM | POA: Insufficient documentation

## 2021-09-04 DIAGNOSIS — Z6838 Body mass index (BMI) 38.0-38.9, adult: Secondary | ICD-10-CM | POA: Diagnosis not present

## 2021-09-04 DIAGNOSIS — E119 Type 2 diabetes mellitus without complications: Secondary | ICD-10-CM | POA: Diagnosis not present

## 2021-09-04 DIAGNOSIS — D259 Leiomyoma of uterus, unspecified: Secondary | ICD-10-CM | POA: Diagnosis not present

## 2021-09-04 HISTORY — PX: ROBOTIC ASSISTED TOTAL HYSTERECTOMY WITH BILATERAL SALPINGO OOPHERECTOMY: SHX6086

## 2021-09-04 LAB — GLUCOSE, CAPILLARY
Glucose-Capillary: 173 mg/dL — ABNORMAL HIGH (ref 70–99)
Glucose-Capillary: 208 mg/dL — ABNORMAL HIGH (ref 70–99)

## 2021-09-04 SURGERY — HYSTERECTOMY, TOTAL, ROBOT-ASSISTED, LAPAROSCOPIC, WITH BILATERAL SALPINGO-OOPHORECTOMY
Anesthesia: General | Laterality: Bilateral

## 2021-09-04 MED ORDER — HYDROMORPHONE HCL 1 MG/ML IJ SOLN
INTRAMUSCULAR | Status: AC
  Filled 2021-09-04: qty 1

## 2021-09-04 MED ORDER — METRONIDAZOLE 500 MG/100ML IV SOLN
INTRAVENOUS | Status: DC | PRN
Start: 2021-09-04 — End: 2021-09-04
  Administered 2021-09-04: 500 mg via INTRAVENOUS

## 2021-09-04 MED ORDER — SODIUM CHLORIDE 0.9 % IV SOLN: INTRAVENOUS | Status: DC

## 2021-09-04 MED ORDER — FENTANYL CITRATE (PF) 100 MCG/2ML IJ SOLN: 25.0000 ug | INTRAMUSCULAR | Status: DC | PRN

## 2021-09-04 MED ORDER — INDOCYANINE GREEN 25 MG IV SOLR
INTRAVENOUS | Status: DC | PRN
Start: 2021-09-04 — End: 2021-09-04
  Administered 2021-09-04: 4 mg

## 2021-09-04 MED ORDER — METRONIDAZOLE 500 MG/100ML IV SOLN
500.0000 mg | Freq: Once | INTRAVENOUS | Status: DC
  Filled 2021-09-04: qty 100

## 2021-09-04 MED ORDER — FENTANYL CITRATE (PF) 100 MCG/2ML IJ SOLN
INTRAMUSCULAR | Status: AC
  Administered 2021-09-04: 50 ug via INTRAVENOUS
  Filled 2021-09-04: qty 2

## 2021-09-04 MED ORDER — BUPIVACAINE HCL (PF) 0.5 % IJ SOLN
INTRAMUSCULAR | Status: DC | PRN
  Administered 2021-09-04: 10 mL

## 2021-09-04 MED ORDER — DEXAMETHASONE SODIUM PHOSPHATE 10 MG/ML IJ SOLN
INTRAMUSCULAR | Status: DC | PRN
  Administered 2021-09-04: 10 mg via INTRAVENOUS

## 2021-09-04 MED ORDER — SUGAMMADEX SODIUM 500 MG/5ML IV SOLN
INTRAVENOUS | Status: DC | PRN
  Administered 2021-09-04: 500 mg via INTRAVENOUS

## 2021-09-04 MED ORDER — KETAMINE HCL 10 MG/ML IJ SOLN
INTRAMUSCULAR | Status: DC | PRN
  Administered 2021-09-04: 20 mg via INTRAVENOUS

## 2021-09-04 MED ORDER — ORAL CARE MOUTH RINSE: 15.0000 mL | Freq: Once | OROMUCOSAL | Status: AC

## 2021-09-04 MED ORDER — MIDAZOLAM HCL 2 MG/2ML IJ SOLN
INTRAMUSCULAR | Status: DC | PRN
  Administered 2021-09-04: 2 mg via INTRAVENOUS

## 2021-09-04 MED ORDER — FAMOTIDINE 20 MG PO TABS
ORAL_TABLET | ORAL | Status: AC
  Administered 2021-09-04: 20 mg via ORAL
  Filled 2021-09-04: qty 1

## 2021-09-04 MED ORDER — PHENYLEPHRINE HCL-NACL 20-0.9 MG/250ML-% IV SOLN
INTRAVENOUS | Status: DC | PRN
  Administered 2021-09-04: 40 ug/min via INTRAVENOUS

## 2021-09-04 MED ORDER — PHENYLEPHRINE HCL (PRESSORS) 10 MG/ML IV SOLN
INTRAVENOUS | Status: DC | PRN
  Administered 2021-09-04 (×2): 80 ug via INTRAVENOUS
  Administered 2021-09-04: 40 ug via INTRAVENOUS

## 2021-09-04 MED ORDER — IBUPROFEN 600 MG PO TABS: 600.0000 mg | ORAL_TABLET | Freq: Four times a day (QID) | ORAL | 0 refills | Status: DC

## 2021-09-04 MED ORDER — HEPARIN SODIUM (PORCINE) 5000 UNIT/ML IJ SOLN
INTRAMUSCULAR | Status: AC
  Administered 2021-09-04: 5000 [IU]
  Filled 2021-09-04: qty 1

## 2021-09-04 MED ORDER — SODIUM CHLORIDE 0.9 % IR SOLN
Status: DC | PRN
  Administered 2021-09-04: 1000 mL

## 2021-09-04 MED ORDER — HEMOSTATIC AGENTS (NO CHARGE) OPTIME
TOPICAL | Status: DC | PRN
  Administered 2021-09-04: 1 via TOPICAL

## 2021-09-04 MED ORDER — ROCURONIUM BROMIDE 100 MG/10ML IV SOLN
INTRAVENOUS | Status: DC | PRN
  Administered 2021-09-04: 20 mg via INTRAVENOUS
  Administered 2021-09-04: 80 mg via INTRAVENOUS

## 2021-09-04 MED ORDER — FENTANYL CITRATE (PF) 100 MCG/2ML IJ SOLN
INTRAMUSCULAR | Status: DC | PRN
  Administered 2021-09-04: 100 ug via INTRAVENOUS
  Administered 2021-09-04: 50 ug via INTRAVENOUS

## 2021-09-04 MED ORDER — ONDANSETRON HCL 4 MG/2ML IJ SOLN
INTRAMUSCULAR | Status: DC | PRN
  Administered 2021-09-04 (×2): 4 mg via INTRAVENOUS

## 2021-09-04 MED ORDER — SUCCINYLCHOLINE CHLORIDE 200 MG/10ML IV SOSY
PREFILLED_SYRINGE | INTRAVENOUS | Status: DC | PRN
  Administered 2021-09-04: 100 mg via INTRAVENOUS

## 2021-09-04 MED ORDER — CHLORHEXIDINE GLUCONATE 0.12 % MT SOLN: 15.0000 mL | Freq: Once | OROMUCOSAL | Status: AC

## 2021-09-04 MED ORDER — HEPARIN SODIUM (PORCINE) 5000 UNIT/ML IJ SOLN: 5000.0000 [IU] | INTRAMUSCULAR | Status: AC

## 2021-09-04 MED ORDER — OXYCODONE-ACETAMINOPHEN 5-325 MG PO TABS: 1.0000 | ORAL_TABLET | Freq: Four times a day (QID) | ORAL | 0 refills | Status: AC | PRN

## 2021-09-04 MED ORDER — CHLORHEXIDINE GLUCONATE 0.12 % MT SOLN
OROMUCOSAL | Status: AC
  Administered 2021-09-04: 15 mL via OROMUCOSAL
  Filled 2021-09-04: qty 15

## 2021-09-04 MED ORDER — FENTANYL CITRATE (PF) 100 MCG/2ML IJ SOLN
INTRAMUSCULAR | Status: AC
  Filled 2021-09-04: qty 2

## 2021-09-04 MED ORDER — OXYCODONE HCL 5 MG PO TABS
5.0000 mg | ORAL_TABLET | Freq: Once | ORAL | Status: AC
  Administered 2021-09-04: 5 mg via ORAL

## 2021-09-04 MED ORDER — FAMOTIDINE 20 MG PO TABS: 20.0000 mg | ORAL_TABLET | Freq: Once | ORAL | Status: AC

## 2021-09-04 MED ORDER — OXYCODONE HCL 5 MG PO TABS
ORAL_TABLET | ORAL | Status: AC
  Filled 2021-09-04: qty 1

## 2021-09-04 MED ORDER — CEFAZOLIN SODIUM-DEXTROSE 2-4 GM/100ML-% IV SOLN
INTRAVENOUS | Status: AC
  Filled 2021-09-04: qty 100

## 2021-09-04 MED ORDER — ONDANSETRON 4 MG PO TBDP: 4.0000 mg | ORAL_TABLET | Freq: Four times a day (QID) | ORAL | 0 refills | Status: DC | PRN

## 2021-09-04 MED ORDER — HYDROMORPHONE HCL 1 MG/ML IJ SOLN
INTRAMUSCULAR | Status: DC | PRN
  Administered 2021-09-04: 1 mg via INTRAVENOUS

## 2021-09-04 MED ORDER — GLYCOPYRROLATE 0.2 MG/ML IJ SOLN
INTRAMUSCULAR | Status: DC | PRN
  Administered 2021-09-04: .2 mg via INTRAVENOUS

## 2021-09-04 MED ORDER — KETAMINE HCL 50 MG/5ML IJ SOSY
PREFILLED_SYRINGE | INTRAMUSCULAR | Status: AC
  Filled 2021-09-04: qty 5

## 2021-09-04 MED ORDER — BUPIVACAINE HCL (PF) 0.5 % IJ SOLN
INTRAMUSCULAR | Status: AC
  Filled 2021-09-04: qty 30

## 2021-09-04 MED ORDER — ONDANSETRON HCL 4 MG/2ML IJ SOLN: 4.0000 mg | Freq: Once | INTRAMUSCULAR | Status: DC | PRN

## 2021-09-04 MED ORDER — 0.9 % SODIUM CHLORIDE (POUR BTL) OPTIME
TOPICAL | Status: DC | PRN
  Administered 2021-09-04: 500 mL

## 2021-09-04 MED ORDER — LIDOCAINE HCL (CARDIAC) PF 100 MG/5ML IV SOSY
PREFILLED_SYRINGE | INTRAVENOUS | Status: DC | PRN
  Administered 2021-09-04: 100 mg via INTRAVENOUS

## 2021-09-04 MED ORDER — VASOPRESSIN 20 UNIT/ML IV SOLN
INTRAVENOUS | Status: DC | PRN
  Administered 2021-09-04 (×2): 2 [IU] via INTRAVENOUS
  Administered 2021-09-04: 4 [IU] via INTRAVENOUS

## 2021-09-04 MED ORDER — PROPOFOL 10 MG/ML IV BOLUS
INTRAVENOUS | Status: AC
  Filled 2021-09-04: qty 20

## 2021-09-04 MED ORDER — LACTATED RINGERS IV SOLN: INTRAVENOUS | Status: DC | PRN

## 2021-09-04 MED ORDER — CEFAZOLIN SODIUM-DEXTROSE 2-4 GM/100ML-% IV SOLN
2.0000 g | INTRAVENOUS | Status: AC
  Administered 2021-09-04: 2 g via INTRAVENOUS

## 2021-09-04 MED ORDER — MIDAZOLAM HCL 2 MG/2ML IJ SOLN
INTRAMUSCULAR | Status: AC
  Filled 2021-09-04: qty 2

## 2021-09-04 MED ORDER — PROPOFOL 10 MG/ML IV BOLUS
INTRAVENOUS | Status: DC | PRN
  Administered 2021-09-04: 120 mg via INTRAVENOUS

## 2021-09-04 MED ORDER — ACETAMINOPHEN 10 MG/ML IV SOLN
INTRAVENOUS | Status: DC | PRN
  Administered 2021-09-04: 1000 mg via INTRAVENOUS

## 2021-09-04 MED ORDER — POVIDONE-IODINE 10 % EX SWAB: 2.0000 "application " | Freq: Once | CUTANEOUS | Status: DC

## 2021-09-04 MED ORDER — EPHEDRINE SULFATE (PRESSORS) 50 MG/ML IJ SOLN
INTRAMUSCULAR | Status: DC | PRN
  Administered 2021-09-04: 10 mg via INTRAVENOUS
  Administered 2021-09-04: 5 mg via INTRAVENOUS
  Administered 2021-09-04: 10 mg via INTRAVENOUS

## 2021-09-04 SURGICAL SUPPLY — 109 items
ADH SKN CLS APL DERMABOND .7 (GAUZE/BANDAGES/DRESSINGS) ×1
ANCHOR TIS RET SYS 235ML (MISCELLANEOUS) IMPLANT
APL PRP STRL LF DISP 70% ISPRP (MISCELLANEOUS) ×1
BAG DRN RND TRDRP ANRFLXCHMBR (UROLOGICAL SUPPLIES) ×1
BAG LAPAROSCOPIC 12 15 PORT 16 (BASKET) IMPLANT
BAG RETRIEVAL 10 (BASKET) ×1
BAG RETRIEVAL 12/15 (BASKET)
BAG TISS RTRVL C235 10X14 (MISCELLANEOUS)
BAG URINE DRAIN 2000ML AR STRL (UROLOGICAL SUPPLIES) ×3 IMPLANT
BLADE SURG SZ11 CARB STEEL (BLADE) ×3 IMPLANT
CANNULA CAP OBTURATR AIRSEAL 8 (CAP) ×3 IMPLANT
CANNULA DILATOR 5 W/SLV (CANNULA) IMPLANT
CATH FOLEY 2WAY  5CC 16FR (CATHETERS) ×1
CATH FOLEY 2WAY 5CC 16FR (CATHETERS) ×1
CATH URTH 16FR FL 2W BLN LF (CATHETERS) ×2 IMPLANT
CHLORAPREP W/TINT 26 (MISCELLANEOUS) ×3 IMPLANT
CNTNR SPEC 2.5X3XGRAD LEK (MISCELLANEOUS)
CONT SPEC 4OZ STER OR WHT (MISCELLANEOUS)
CONT SPEC 4OZ STRL OR WHT (MISCELLANEOUS)
CONTAINER SPEC 2.5X3XGRAD LEK (MISCELLANEOUS) IMPLANT
COUNTER NEEDLE 20/40 LG (NEEDLE) ×1 IMPLANT
COVER TIP SHEARS 8 DVNC (MISCELLANEOUS) ×2 IMPLANT
COVER TIP SHEARS 8MM DA VINCI (MISCELLANEOUS) ×1
COVER WAND RF STERILE (DRAPES) ×3 IMPLANT
DEFOGGER SCOPE WARMER CLEARIFY (MISCELLANEOUS) ×3 IMPLANT
DERMABOND ADVANCED (GAUZE/BANDAGES/DRESSINGS) ×1
DERMABOND ADVANCED .7 DNX12 (GAUZE/BANDAGES/DRESSINGS) ×2 IMPLANT
DRAPE 3/4 80X56 (DRAPES) ×3 IMPLANT
DRAPE ARM DVNC X/XI (DISPOSABLE) ×8 IMPLANT
DRAPE COLUMN DVNC XI (DISPOSABLE) ×2 IMPLANT
DRAPE DA VINCI XI ARM (DISPOSABLE) ×4
DRAPE DA VINCI XI COLUMN (DISPOSABLE) ×1
DRESSING SURGICEL FIBRLLR 1X2 (HEMOSTASIS) IMPLANT
DRSG SURGICEL FIBRILLAR 1X2 (HEMOSTASIS) ×2
DRSG TEGADERM 4X4.75 (GAUZE/BANDAGES/DRESSINGS) ×1 IMPLANT
ELECT BLADE 6 FLAT ULTRCLN (ELECTRODE) IMPLANT
ELECT REM PT RETURN 9FT ADLT (ELECTROSURGICAL) ×2
ELECTRODE REM PT RTRN 9FT ADLT (ELECTROSURGICAL) ×2 IMPLANT
GAUZE 4X4 16PLY ~~LOC~~+RFID DBL (SPONGE) ×6 IMPLANT
GLOVE SURG ENC MOIS LTX SZ6.5 (GLOVE) ×18 IMPLANT
GLOVE SURG UNDER POLY LF SZ6.5 (GLOVE) ×12 IMPLANT
GOWN STRL REUS W/ TWL LRG LVL3 (GOWN DISPOSABLE) ×16 IMPLANT
GOWN STRL REUS W/TWL LRG LVL3 (GOWN DISPOSABLE) ×16
GRASPER SUT TROCAR 14GX15 (MISCELLANEOUS) ×1 IMPLANT
IRRIGATION STRYKERFLOW (MISCELLANEOUS) IMPLANT
IRRIGATOR STRYKERFLOW (MISCELLANEOUS) ×2
IV NS 1000ML (IV SOLUTION) ×2
IV NS 1000ML BAXH (IV SOLUTION) IMPLANT
KIT IMAGING PINPOINTPAQ (MISCELLANEOUS) IMPLANT
KIT PINK PAD W/HEAD ARE REST (MISCELLANEOUS) ×2
KIT PINK PAD W/HEAD ARM REST (MISCELLANEOUS) ×2 IMPLANT
LABEL OR SOLS (LABEL) ×3 IMPLANT
LIGASURE VESSEL 5MM BLUNT TIP (ELECTROSURGICAL) IMPLANT
MANIFOLD NEPTUNE II (INSTRUMENTS) ×3 IMPLANT
MANIPULATOR VCARE STD CRV RETR (MISCELLANEOUS) ×1 IMPLANT
NDL INSUFF ACCESS 14 VERSASTEP (NEEDLE) ×3 IMPLANT
NDL SPNL 22GX3.5 QUINCKE BK (NEEDLE) IMPLANT
NEEDLE HYPO 22GX1.5 SAFETY (NEEDLE) ×3 IMPLANT
NEEDLE SPNL 22GX3.5 QUINCKE BK (NEEDLE) ×2 IMPLANT
NEEDLE VERESS 14GA 120MM (NEEDLE) ×3 IMPLANT
NS IRRIG 1000ML POUR BTL (IV SOLUTION) ×3 IMPLANT
OBTURATOR OPTICAL STANDARD 8MM (TROCAR)
OBTURATOR OPTICAL STND 8 DVNC (TROCAR)
OBTURATOR OPTICALSTD 8 DVNC (TROCAR) IMPLANT
OCCLUDER COLPOPNEUMO (BALLOONS) ×3 IMPLANT
PACK GYN LAPAROSCOPIC (MISCELLANEOUS) ×3 IMPLANT
PAD OB MATERNITY 4.3X12.25 (PERSONAL CARE ITEMS) ×3 IMPLANT
PAD PREP 24X41 OB/GYN DISP (PERSONAL CARE ITEMS) ×3 IMPLANT
PENCIL ELECTRO HAND CTR (MISCELLANEOUS) ×1 IMPLANT
PORT ACCESS TROCAR AIRSEAL 5 (TROCAR) ×1 IMPLANT
RELOAD STAPLE 45 3.5 BLU DVNC (STAPLE) IMPLANT
RELOAD STAPLER 3.5X45 BLU DVNC (STAPLE) ×2 IMPLANT
SEAL CANN UNIV 5-8 DVNC XI (MISCELLANEOUS) ×6 IMPLANT
SEAL XI 5MM-8MM UNIVERSAL (MISCELLANEOUS) ×3
SEALER VESSEL DA VINCI XI (MISCELLANEOUS) ×1
SEALER VESSEL EXT DVNC XI (MISCELLANEOUS) IMPLANT
SET CYSTO W/LG BORE CLAMP LF (SET/KITS/TRAYS/PACK) IMPLANT
SET TUBE FILTERED XL AIRSEAL (SET/KITS/TRAYS/PACK) ×3 IMPLANT
SET TUBE SMOKE EVAC HIGH FLOW (TUBING) ×3 IMPLANT
SLEEVE ENDOPATH XCEL 5M (ENDOMECHANICALS) IMPLANT
SLEEVE VERSASTEP EXPAND ONEST (MISCELLANEOUS) IMPLANT
SOLUTION ELECTROLUBE (MISCELLANEOUS) ×3 IMPLANT
SPONGE GAUZE 2X2 8PLY STRL LF (GAUZE/BANDAGES/DRESSINGS) ×1 IMPLANT
STAPLER 45 DA VINCI SURE FORM (STAPLE) ×1
STAPLER 45 SUREFORM DVNC (STAPLE) IMPLANT
STAPLER RELOAD 3.5X45 BLU DVNC (STAPLE) ×2
STAPLER RELOAD 3.5X45 BLUE (STAPLE) ×2
SUT DVC VLOC 180 0 12IN GS21 (SUTURE) ×2
SUT MNCRL 4-0 (SUTURE) ×2
SUT MNCRL 4-0 27XMFL (SUTURE) ×1
SUT MNCRL AB 4-0 PS2 18 (SUTURE) ×2 IMPLANT
SUT VIC AB 0 CT1 36 (SUTURE) ×3 IMPLANT
SUT VIC AB 2-0 SH 27 (SUTURE) ×4
SUT VIC AB 2-0 SH 27XBRD (SUTURE) IMPLANT
SUT VICRYL 0 UR6 27IN ABS (SUTURE) ×3 IMPLANT
SUTURE DVC VLC 180 0 12IN GS21 (SUTURE) IMPLANT
SUTURE MNCRL 4-0 27XMF (SUTURE) ×2 IMPLANT
SYR 10ML LL (SYRINGE) ×3 IMPLANT
SYR 3ML LL SCALE MARK (SYRINGE) ×1 IMPLANT
SYR 50ML LL SCALE MARK (SYRINGE) ×3 IMPLANT
SYR TOOMEY 50ML (SYRINGE) IMPLANT
SYS BAG RETRIEVAL 10MM (BASKET) ×1
SYSTEM BAG RETRIEVAL 10MM (BASKET) IMPLANT
TAPE TRANSPORE STRL 2 31045 (GAUZE/BANDAGES/DRESSINGS) ×3 IMPLANT
TRAP SPECIMEN MUCUS 40CC (MISCELLANEOUS) ×1 IMPLANT
TROCAR VERSASTEP PLUS 12MM (TROCAR) ×3 IMPLANT
TROCAR XCEL NON-BLD 11X100MML (ENDOMECHANICALS) IMPLANT
TROCAR XCEL NON-BLD 5MMX100MML (ENDOMECHANICALS) IMPLANT
WATER STERILE IRR 500ML POUR (IV SOLUTION) ×3 IMPLANT

## 2021-09-04 NOTE — Progress Notes (Signed)
Patient awake/alert x4.  Dr. Rosey Bath made aware of blood sugar 208, no new orders, OK for patient to be discharged.  ?

## 2021-09-04 NOTE — OR Nursing (Signed)
0938 verbal order per Dr Theora Gianotti '500mg'$  Zosyn IVPB to be transfused in [place]; pharmacy notified; Megan, RN placed order in [place]; no further orders at this time ?

## 2021-09-04 NOTE — Discharge Instructions (Addendum)
AMBULATORY SURGERY  ?DISCHARGE INSTRUCTIONS ? ? ?The drugs that you were given will stay in your system until tomorrow so for the next 24 hours you should not: ? ?Drive an automobile ?Make any legal decisions ?Drink any alcoholic beverage ? ? ?You may resume regular meals tomorrow.  Today it is better to start with liquids and gradually work up to solid foods. ? ?You may eat anything you prefer, but it is better to start with liquids, then soup and crackers, and gradually work up to solid foods. ? ? ?Please notify your doctor immediately if you have any unusual bleeding, trouble breathing, redness and pain at the surgery site, drainage, fever, or pain not relieved by medication. ? ? ? ?Additional Instructions: ? ?Please contact your physician with any problems or Same Day Surgery at [phone], Monday through Friday 6 am to 4 pm, or [place] at [facility] number at [phone].  ? ? ?Care for wound on buttock: ?1) Remove bandaging 24 hours after surgery (around 11 AM 09/05/21) ?2) Shower (can use shower head to rinse) twice daily, gently clean area and pat dry ?3) Wear loose-fitting clothing.   ?4) Keep follow up appointment in 1 week. ?

## 2021-09-04 NOTE — Progress Notes (Signed)
Patient has also declined Cell Saver technology for replacement of blood products.   ? ?Prentice Docker, MD, FACOG ?[facility] ?09/04/2021 7:57 AM   ?

## 2021-09-04 NOTE — Op Note (Addendum)
Robotic assisted laparoscopic Partial Cecectomy ? ?Pre-operative Diagnosis: Appendiceal mass cystic lesion  ? ?Post-operative Diagnosis: same ? ?Procedure:  ?Robotic assisted laparoscopic Partial cecectomy ? ?Surgeon: Caroleen Hamman, MD FACS ? ?Anesthesia: Gen. with endotracheal tube ? ?Findings: ?Mass/ cystic lesion tip of the appendix measures 2 cms diameter clinically suspicious for mucinous neoplasm ?NO evidence of metastatic disease within the abdominal cavity ? ?1st Assist: Dr. Glennon Calhoun ( required for exposure ) ? ?Estimated Blood Loss: minimal for my portion ?      ?Specimens: cecum   ?      ?Complications: none ? ? ?Procedure Details  ?I was called for an intraoperative consult by Dr. Theora Calhoun and Dr. Glennon Calhoun.  Sue Calhoun was undergoing a robotic hysterectomy.  GYN team found an abnormality within the appendix and I was called to assess intraoperatively.  The patient was already intubated and the robot was redocked.  She did have 3 robotic ports and one additional assist port and Dr. Glennon Calhoun was at the bedside.  Dr. Theora Calhoun was at the consult.  I went to the console and visualize the appendix.  There was a cystic mass within the tip of the appendix measuring about 2 cm.  I also assessed the mesentery and adjacent pelvic structures and peritoneum and found to have no evidence of metastatic disease.  Given my concern for mucinous neoplasm I recommended to proceed with cecectomy. ?I replaced the camera for a 30 degree camera and also Dr. Glennon Calhoun was able to upgrade the left lateral port for a 12 mm port so we can insert a stapler.  I was able to elevate the appendix and mobilize the white line of [place].  This Sequent had to be mobilized in order to have good exposure and good margin of the cecum given  the concern for appendiceal neoplasm.  I was able to divide the mesoappendix with the vessel sealer.  Please note that the mass of the tip of the appendix seem to be confined and not adhered to the peritoneum.  I was  able to obtain a good window and a good mobilization of the cecum.  I took at least a 6 1 cm circumferential margin of the cecum and this was performed with 2 loads of a 45 mm blue load stapler.  I was very careful not to incorporate the terminal ileum within the resection.  Excellent hemostasis was observed.  No evidence of leaks were seen. ? ?The specimen was placed in a bag and removed.  Inspection of the specimen reveal a cystic lesion in the tip of the appendix.  Specimen was sent for permanent pathology. ?I Turned the case back to Dr. Glennon Calhoun and Dr. Theora Calhoun who will complete their gynecological procedure. The  Son was informed in detail and he agreed verbally with the procedure.  ? ? ? ?     ?Caroleen Hamman, MD, FACS  ?

## 2021-09-04 NOTE — Anesthesia Procedure Notes (Signed)
Procedure Name: Intubation ?Date/Time: 09/04/2021 7:55 AM ?Performed by: Kelton Pillar, CRNA ?Pre-anesthesia Checklist: Patient identified, Emergency Drugs available, Suction available and Patient being monitored ?Patient Re-evaluated:Patient Re-evaluated prior to induction ?Oxygen Delivery Method: Circle system utilized ?Preoxygenation: Pre-oxygenation with 100% oxygen ?Induction Type: IV induction ?Ventilation: Mask ventilation without difficulty ?Laryngoscope Size: McGraph and 3 ?Grade View: Grade I ?Tube type: Oral ?Tube size: 7.0 mm ?Number of attempts: 1 ?Airway Equipment and Method: Stylet and Oral airway ?Placement Confirmation: ETT inserted through vocal cords under direct vision, positive ETCO2, breath sounds checked- equal and bilateral and CO2 detector ?Secured at: 21 cm ?Tube secured with: Tape ?Dental Injury: Teeth and Oropharynx as per pre-operative assessment  ? ? ? ? ?

## 2021-09-04 NOTE — Anesthesia Preprocedure Evaluation (Signed)
Anesthesia Evaluation  ?Patient identified by MRN, date of birth, ID band ?Patient awake ? ?General Assessment Comment:Patient does not see doctors regularly ? ?Reviewed: ?Allergy & Precautions, NPO status , Patient's Chart, lab work & pertinent test results ? ?History of Anesthesia Complications ?Negative for: history of anesthetic complications ? ?Airway ?Mallampati: II ? ?TM Distance: >3 FB ?Neck ROM: Full ? ? ? Dental ? ?(+) Edentulous Upper, Edentulous Lower, Dental Advidsory Given ?  ?Pulmonary ?neg pulmonary ROS, neg shortness of breath, neg sleep apnea, neg COPD, neg recent URI, Patient abstained from smoking.Not current smoker,  ?  ?Pulmonary exam normal ?breath sounds clear to auscultation ? ? ? ? ? ? Cardiovascular ?Exercise Tolerance: Good ?METS: 5 - 7 Mets (-) hypertension(-) angina(-) CAD and (-) Past MI (-) dysrhythmias  ?Rhythm:Regular Rate:Normal ?- Systolic murmurs ? ?  ?Neuro/Psych ?PSYCHIATRIC DISORDERS Anxiety negative neurological ROS ?   ? GI/Hepatic ?neg GERD  ,(+)  ?  ? (-) substance abuse ? ,   ?Endo/Other  ?diabetes ? Renal/GU ?negative Renal ROS  ? ?  ?Musculoskeletal ? ? Abdominal ?(+) + obese,   ?Peds ? Hematology ?  ?Anesthesia Other Findings ?Past Medical History: ?No date: Anxiety ?No date: Diabetes mellitus without complication ([place]) ?    Comment:  not started on medication yet ? Reproductive/Obstetrics ? ?  ? ? ? ? ? ? ? ? ? ? ? ? ? ?  ?  ? ? ? ? ? ? ? ? ?Anesthesia Physical ? ?Anesthesia Plan ? ?ASA: 2 ? ?Anesthesia Plan: General  ? ?Post-op Pain Management: Ofirmev IV (intra-op) and Toradol IV (intra-op)  ? ?Induction: Intravenous ? ?PONV Risk Score and Plan: 3 and Ondansetron, Dexamethasone and Treatment may vary due to age or medical condition ? ?Airway Management Planned: Oral ETT ? ?Additional Equipment: None ? ?Intra-op Plan:  ? ?Post-operative Plan: Extubation in OR ? ?Informed Consent: I have reviewed the patients History and Physical,  chart, labs and discussed the procedure including the risks, benefits and alternatives for the proposed anesthesia with the patient or authorized representative who has indicated his/her understanding and acceptance.  ? ? ? ?Dental advisory given ? ?Plan Discussed with: CRNA and Surgeon ? ?Anesthesia Plan Comments: (Discussed risks of anesthesia with patient, including PONV, sore throat, lip/dental/eye damage. Rare risks discussed as well, such as cardiorespiratory and neurological sequelae, and allergic reactions. Discussed the role of CRNA in patient's perioperative care. Patient understands.)  ? ? ? ? ? ? ?Anesthesia Quick Evaluation ? ?

## 2021-09-04 NOTE — Op Note (Signed)
Operative Note   Date 09/04/2021 TIME 11:11 AM  PRE-OP DIAGNOSIS: Endometrial intraepithelial neoplasia. Buttock skin lesion.   POST-OP DIAGNOSIS: Endometrial intraepithelial neoplasia. Buttock skin lesion. Pelvic adhesive disease. Abnormal appendix.   SURGEON: Surgeon(s) and Role: Panel 1:  Cleburne Savini Gaetana Michaelis, MD  ASSISTANT:  Prentice Docker, MD  INTRAOPERATIVE CONSULT:  Caroleen Hamman, MD  ANESTHESIA: General  PROCEDURE: Procedure(s): Exam under anesthesia, Robotic assisted total hysterectomy and bilateral salpingo-oophorectomy with sentinel node injection and mapping; resection buttock lesion; adhesiolysis; and appendectomy with partial cecectomy (Dr. Dahlia Byes)  ESTIMATED BLOOD LOSS: 50 cc  DRAINS: Foley  TOTAL IV FLUIDS: 1200 cc  UOP: approximately 1600 cc  SPECIMENS:  Uterus, and bilateral fallopian tubes and ovaries, washings, buttock lesion, appendix with partial cecum   COMPLICATIONS: None  DISPOSITION: PACU  CONDITION: Stable  INDICATIONS: EIN  FINDINGS: Exam under anesthesia revealed an normal 6-8 week mobile anteverted uterus. There were no adnexal masses or nodularity. The parametria was smooth. The cervix was negative for gross lesions. Intraoperative findings included: The uterus was normal size and shape. The adnexa were normal bilaterally. Adhesions present between uterus, rectum, left side wall, and appendix. The upper abdomen was normal including omentum, bowel, liver, stomach, and diaphragmatic surfaces except for the appendix with a 3 cm bulbus lesion on the appendix. There was no evidence of grossly enlarged pelvic or right para-aortic lymph nodes. The sentinel nodes mapped to the right  and left obturator nodes.   Intraoperative pathology: no uterine malignancy.   PROCEDURE IN DETAIL: After informed consent was obtained, the patient was taken to the operating room where anesthesia was obtained without difficulty. The patient was positioned in the  dorsal lithotomy position in [facility] and her arms were carefully tucked at her sides and the usual precautions were taken.  She was prepped and draped in normal sterile fashion.  Time-out was performed.    The right buttock lesion was removed sharply. The area was irrigated and defect closed with 3 interrupted 2-0 sutures. The wound was dressed using a sterile dressing.   A speculum was placed in the vagina and the cervical os was dilator. The uterus sounded to 7 cm.  SNL injection performed. A medium V-Care uterine manipulator was then placed in the uterus without incident.    Operative entry was obtained via a supraumbilical incision and direct entry by Dr. Glennon Mac. The abdomen insuffulated, and pelvis visualized with noted findings above. Dr .Glennon Mac also placed the robotic ports and LUQ port placement. The patient was placed in Trendelenburg and the bowel was displaced up into the upper abdomen.  Washings obtained. Robotic docking was performed and instruments inserted. Round ligaments were divided on each side and the retroperitoneal space was opened bilaterally. The infundibulopelvic ligaments were skeletonized, and the ureters were identified and preserved.  The retroperitoneal spaces were opened and sentinel nodes identified as well as the iliac vessels and obturator nerves. The nodes were not removed pending pathology evaluation.   The bilateral infundibular ligaments were sealed and divided.  A bladder flap was created and the bladder was dissected down off the lower uterine segment and cervix. The uterus was manipulated to allow exposure of the uterine arteries. The uterine arteries were skeletonized bilaterally, sealed and divided with cautery and transected.  A colpotomy was performed circumferentially along the V-Care ring with electrocautery and the cervix was incised from the vagina. The V-care and specimen was removed.  A pneumo balloon was placed in the vagina and ring forceps used  to removed both sentinel lymph nodes. The vaginal cuff was then closed in a running continuous fashion using 0 V-Lock suture with careful attention to include the vaginal cuff angles and the vaginal mucosa within the closure.    See Dr. Corlis Leak operative note for complete detail for appendectomy with partial cecectomy .  Hemostasis was observed. The intraperitoneal pressure was dropped, and all planes of dissection, vascular pedicles and the vaginal cuff were found to be hemostatic.  Fibrillar was placed on raw surfaces. The LLQ trocar removed and the port closed with 0-Vicryl using the PMI.  The trocars were removed.   The skin incision at the umbilicus was closed with subcuticular stitch.  The remaining skin incisions were closed with subcuticular stitch and skin glue.  The patient tolerated the procedure well.  Sponge, lap and needle counts were correct x2.  The patient was taken to recovery room in excellent condition.  Antibiotics: Given 1st generation cephalosporin and metronidazole, Antibiotics given within 1 hour of the start of the procedure, Antibiotics ordered to be discontinued within 24 hours post procedur   VTE prophylaxis: was ordered perioperatively.  Dr. Glennon Mac assisted me with the procedure which could not have been performed without his assistance. This was a high-level case requiring a Insurance risk surveyor. Dr. Glennon Mac performed the initial abdominal entry, placement of robotic port, docking, and insertion of robotic instruments while I performed the vaginal/buttock part of the procedure and SLN injection. He also provided high level assistance throughout the procedure that was needed for resection of appendix and cecum with Dr. Dahlia Byes. He also provided uterine manipulation.   Gillis Ends, MD

## 2021-09-04 NOTE — Interval H&P Note (Signed)
History and Physical Interval Note: ? ?09/04/2021 ?7:41 AM ? ?Sue Calhoun  has presented today for surgery, with the diagnosis of Endometrial Neoplasia, Uterovaginal Prolapse, Posterior Prolapse, Stress Urinary Incontinence.  The various methods of treatment have been discussed with the patient and family. After consideration of risks, benefits and other options for treatment, the patient has consented to  Procedure(s): ?XI ROBOTIC [place], SENTINEL LYMPH NODE INJECTION MAPPING AND BIOPSIES, SKIN TAG REMOVAL (Bilateral) as a surgical intervention.  The patient's history has been reviewed, patient examined, no change in status, stable for surgery.  I have reviewed the patient's chart and labs.  Questions were answered to the patient's satisfaction.   ? ?Discussion around blood products. She is willing to accept albumin and plasma. She would not accept red cells under any circumstance or other blood components accept the ones mentioned above.  She voiced understanding that she could die if we were unable to give her these products and verbalized that she would accept death rather than receive a blood transfusion. We will attempt to do everything possible to keep this from being necessary. However, there are sometimes limitations in what we can do to keep her alive.  Will proceed with planned surgery. ? ?Prentice Docker, MD, FACOG ?[facility] ?09/04/2021 7:44 AM   ?

## 2021-09-04 NOTE — Transfer of Care (Signed)
Immediate Anesthesia Transfer of Care Note ? ?Patient: Sue Calhoun ? ?Procedure(s) Performed: XI ROBOTIC ASSISTED TOTAL HYSTERECTOMY WITH BILATERAL SALPINGO OOPHORECTOMY, SENTINEL LYMPH NODE INJECTION MAPPING SKIN TAG REMOVAL (Bilateral) ? ?Patient Location: PACU ? ?Anesthesia Type:General ? ?Level of Consciousness: awake, drowsy and patient cooperative ? ?Airway & Oxygen Therapy: Patient Spontanous Breathing and Patient connected to face mask oxygen ? ?Post-op Assessment: Report given to RN and Post -op Vital signs reviewed and stable ? ?Post vital signs: Reviewed and stable ? ?Last Vitals:  ?Vitals Value Taken Time  ?BP 127/49 09/04/21 1115  ?Temp    ?Pulse 74 09/04/21 1116  ?Resp 24 09/04/21 1116  ?SpO2 98 % 09/04/21 1116  ?Vitals shown include unvalidated device data. ? ?Last Pain:  ?Vitals:  ? 09/04/21 0610  ?TempSrc: Temporal  ?PainSc: 0-No pain  ?   ? ?  ? ?Complications: No notable events documented. ?

## 2021-09-05 ENCOUNTER — Telehealth: Payer: Self-pay

## 2021-09-05 ENCOUNTER — Encounter: Payer: Self-pay | Admitting: Obstetrics and Gynecology

## 2021-09-05 LAB — URINE CULTURE: Culture: NO GROWTH

## 2021-09-05 NOTE — Telephone Encounter (Signed)
Urine culture with no growth

## 2021-09-05 NOTE — Anesthesia Postprocedure Evaluation (Signed)
Anesthesia Post Note ? ?Patient: Sue Calhoun ? ?Procedure(s) Performed: XI ROBOTIC ASSISTED TOTAL HYSTERECTOMY WITH BILATERAL SALPINGO OOPHORECTOMY, SENTINEL LYMPH NODE INJECTION MAPPING SKIN TAG REMOVAL; partial cecectomy (Bilateral) ? ?Patient location during evaluation: PACU ?Anesthesia Type: General ?Level of consciousness: awake and alert ?Pain management: pain level controlled ?Vital Signs Assessment: post-procedure vital signs reviewed and stable ?Respiratory status: spontaneous breathing, nonlabored ventilation, respiratory function stable and patient connected to nasal cannula oxygen ?Cardiovascular status: blood pressure returned to baseline and stable ?Postop Assessment: no apparent nausea or vomiting ?Anesthetic complications: no ? ? ?No notable events documented. ? ? ?Last Vitals:  ?Vitals:  ? 09/04/21 1145 09/04/21 1212  ?BP: 130/66 (!) 152/69  ?Pulse: 77 74  ?Resp: 11 16  ?Temp: 36.7 ?C (!) 36.1 ?C  ?SpO2: 99% 99%  ?  ?Last Pain:  ?Vitals:  ? 09/05/21 1023  ?TempSrc:   ?PainSc: 3   ? ? ?  ?  ?  ?  ?  ?  ? ?Martha Clan ? ? ? ? ?

## 2021-09-06 LAB — CYTOLOGY - NON PAP

## 2021-09-11 ENCOUNTER — Other Ambulatory Visit: Payer: Self-pay

## 2021-09-11 ENCOUNTER — Telehealth: Payer: Self-pay

## 2021-09-11 ENCOUNTER — Inpatient Hospital Stay
Payer: No Typology Code available for payment source | Attending: Obstetrics and Gynecology | Admitting: Obstetrics and Gynecology

## 2021-09-11 VITALS — BP 175/61 | HR 56 | Temp 96.3°F | Resp 19 | Ht 65.0 in | Wt 230.7 lb

## 2021-09-11 DIAGNOSIS — C541 Malignant neoplasm of endometrium: Secondary | ICD-10-CM | POA: Insufficient documentation

## 2021-09-11 DIAGNOSIS — D373 Neoplasm of uncertain behavior of appendix: Secondary | ICD-10-CM

## 2021-09-11 DIAGNOSIS — Z90710 Acquired absence of both cervix and uterus: Secondary | ICD-10-CM

## 2021-09-11 DIAGNOSIS — Z90722 Acquired absence of ovaries, bilateral: Secondary | ICD-10-CM

## 2021-09-11 DIAGNOSIS — Z9889 Other specified postprocedural states: Secondary | ICD-10-CM

## 2021-09-11 DIAGNOSIS — C181 Malignant neoplasm of appendix: Secondary | ICD-10-CM

## 2021-09-11 NOTE — Telephone Encounter (Signed)
Spoke with patient about her CT scan and follow up. The patient is scheduled for a CT of the abdomen and pelvis at [facility] on 09/12/21 at 2:30 pm. She will need to arrive at the [place] entrance at 2:00 pm and have nothing to eat for 4 hours prior. She will need to pick up a prep kit prior to this. She will follow up with Dr Dahlia Byes on 09/18/21. ?

## 2021-09-11 NOTE — Progress Notes (Signed)
Gynecologic Oncology Consult Visit   Referring Provider: Thomasene Mohair, MD  Chief Concern: Endometrial cancer, post op exam.  Subjective:  Sue Calhoun is a 71 y.o. [phone] female who is seen in consultation from Dr. Jean Rosenthal for EIN.  Returns today for incision check for buttocks lesion that was excised.  No complaints.   Oncology History She initially presented with history of postmenopausal bleeding in 2020 and went to the ER for evaluation. Ultrasound noted enlarged endometrial lining on ultrasound. A growth was noted on pelvic exam. She was seen by Dr. Jean Rosenthal who described a ". . . dark-red/brown mass extending from the cervix to the just outside the introitus. The mass is mobile and I could not find any areas to where it might be fixed in the vagina. I was unable to get a finger around the mass to definitively appreciate the cervix. Otherwise, an internal exam was prohibitive due to the mass and body habitus."   07/17/2021 Biopsy of the mass in clinic revealed  BENIGN ENDOMETRIAL  POLYP WITH BREAKDOWN CHANGES. NO HYPERPLASIA OR CARCINOMA.  07/25/2021 she underwent hysteroscopy, dilation and curettage, and Myosure. The previously seen cervical/uterine mass was no longer present.   DIAGNOSIS:  A. 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   B. 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   She has retired but works as Engineer, structural for elderly gentleman.  HbA1c = 8 05/19/21   09/04/21 - underwent robotic TLH, BSO, SLN mapping, appendectomy, resection of small benign right buttocks lesion.  Intra op frozen did not show invasive cancer, so SLNs not removed.  Final pathology showed stage IA endometrioid cancer with 8/19 mm invasion.  No LVSI, and adnexa, cervix and washings negative.   Appendiceal  lesion noted at tip and Dr Everlene Farrier resected appendix. Final path showed LAMIN with negative margins.   DIAGNOSIS:  A. SKIN AND SUBCUTANEOUS TISSUE, RIGHT BUTTOCK; EXCISION:  - BENIGN FIBROEPITHELIAL POLYP.  - NEGATIVE FOR ATYPIA AND MALIGNANCY.   B. UTERUS WITH CERVIX, BILATERAL FALLOPIAN TUBES AND OVARIES; TOTAL  HYSTERECTOMY WITH BILATERAL SALPINGO-OOPHORECTOMY:  - ENDOMETRIUM:       - ENDOMETRIOID ADENOCARCINOMA, LOW GRADE (FIGO 1), WITH FOCI OF  MICROCYSTIC, ELONGATED, AND FRAGMENTED (MELF) PATTERN OF INVASION.       - EXTENSIVE ENDOMETRIAL INTRAEPITHELIAL NEOPLASIA (ATYPICAL  HYPERPLASIA/EIN).       - SEE CANCER SUMMARY.  - MYOMETRIUM:       - ADENOMYOSIS, WITH INVOLVEMENT BY ATYPICAL HYPERPLASIA/EIN.       - LEIOMYOMATA UTERI.       - NEGATIVE FOR FEATURES OF MALIGNANCY.  - UTERINE CERVIX:       - BENIGN TRANSFORMATION ZONE DISPLAYING ULCERATION AND GRANULATION  TISSUE TYPE CHANGES.       - NEGATIVE FOR SQUAMOUS INTRAEPITHELIAL LESION AND MALIGNANCY.  - UTERINE SEROSA:       - ENDOMETRIOSIS.       - NEGATIVE FOR ATYPICAL HYPERPLASIA/EIN AND MALIGNANCY.  - FALLOPIAN TUBES:       - NO SIGNIFICANT HISTOPATHOLOGIC CHANGE.  - OVARIES:       - BENIGN SEROUS CYSTADENOMAS, LEFT OVARY.       - FOCAL ENDOMETRIOSIS, LEFT OVARY.       - BENIGN PHYSIOLOGIC CHANGES.       - NEGATIVE FOR MALIGNANCY.   Comment:  Multiple additional deeper recut levels were examined for selected  blocks. The  entire endometrial surface is submitted for histologic  evaluation. This case is reviewed together with the previous biopsy  ([id number]). The majority of the endometrial surface displays atypical  hyperplasia/EIN. However, one nodule shows areas of cribriform growth  and mucinous differentiation, as well as numerous small, microcystic  glands, with areas of epithelial attenuation and inflammatory response  at the epithelial-mesenchymal interface, compatible with a "MELF"  pattern of invasion.    Tumor Size: Greatest dimension: 0.8 x 0.8 cm  Histologic Type: Endometrioid carcinoma, NOS  Histologic Grade: Low-grade (FIGO 1)  Myometrial Invasion: Present       Depth of invasion (millimeters): 8 mm       Myometrial thickness (millimeters): 19 mm       Percentage of myometrial invasion: 42%  Uterine Serosa Involvement: Not identified  Cervical Stromal Involvement: Not identified  Other Tissue/Organ Involvement: Not identified  Peritoneal/Ascitic Fluid: Malignant cells not identified  Lymphatic and/or Vascular Invasion: Not identified   C. APPENDIX; APPENDECTOMY:  - LOW-GRADE APPENDICEAL MUCINOUS NEOPLASM, WITH ACELLULAR MUCIN PRESENT  AT SEROSAL SURFACE.   Tumor site: Distal half of appendix  Histologic type: Low-grade appendiceal mucinous neoplasm  Histologic Grade: G1, well-differentiated  Tumor Size: 3.3 x 3.1 x 2.4 cm  Tumor Deposits: Not identified  Tumor Extent: Acellular mucin invades visceral peritoneum (serosa)  Lymphovascular Invasion: Not identified   Problem List: Patient Active Problem List   Diagnosis Date Noted   Endometrial mass 07/22/2018   Postmenopausal bleeding 07/01/2018   Skin lesion of right lower extremity 07/01/2018   Anxiety 11/17/2017   Depression 11/17/2017   Class 2 obesity due to excess calories without serious comorbidity with body mass index (BMI) of 37.0 to 37.9 in adult 12/09/2012   Foot pain 06/10/2012    Past Medical History: Past Medical History:  Diagnosis Date   Anxiety    Diabetes mellitus without complication (HCC)    not started on medication yet   Endometrial mass     Past Surgical History: Past Surgical History:  Procedure Laterality Date   HYSTEROSCOPY WITH D & C N/A 07/25/2021   Procedure: DILATATION AND CURETTAGE /HYSTEROSCOPY -REMOVAL OF CERVICAL/ UTERINE MASS;  Surgeon: Conard Novak, MD;  Location: [facility] ORS;  Service: Gynecology;  Laterality: N/A;   NO PAST SURGERIES      Past Gynecologic History:   Menarche: age 84.  Post menopausal Hx of Chlamydia in April 1988.    OB History:  OB History  Gravida Para Term Preterm AB Living  6 1 1   5 1   SAB IAB Ectopic Multiple Live Births  5       1    # Outcome Date GA Lbr Len/2nd Weight Sex Delivery Anes PTL Lv  6 SAB           5 Term           4 SAB           3 SAB           2 SAB           1 SAB             Obstetric Comments  1 vaginal delivery    Family History: Family History  Problem Relation Age of Onset   Diabetes Mother    Cancer Mother 71       thyroid cancer   Diabetes Father    Diabetes Sister    Skin cancer Sister    Diabetes Sister  Diabetes Sister    Diabetes Sister    Diabetes Sister    Diabetes Brother    Breast cancer Maternal Grandmother 48    Social History: Social History   Socioeconomic History   Marital status: Single    Spouse name: Not on file   Number of children: 1   Years of education: Not on file   Highest education level: Not on file  Occupational History   Not on file  Tobacco Use   Smoking status: Never   Smokeless tobacco: Never  Vaping Use   Vaping Use: Never used  Substance and Sexual Activity   Alcohol use: Not Currently   Drug use: Never   Sexual activity: Not Currently    Birth control/protection: Post-menopausal  Other Topics Concern   Not on file  Social History Narrative   Lives with son. She works as a Engineer, structural to an elderly gentleman. Helps to take care of her 5 sisters, all of whom are disabled.    Social Determinants of Health   Financial Resource Strain: Not on file  Food Insecurity: Not on file  Transportation Needs: Not on file  Physical Activity: Not on file  Stress: Not on file  Social Connections: Not on file  Intimate Partner Violence: Not on file    Allergies: No Known Allergies  Current Medications: Current Outpatient Medications  Medication Sig Dispense Refill   ALPRAZolam (XANAX) 1 MG tablet Take 1 mg by mouth 3 (three) times  daily as needed for anxiety.     Spirulina 500 MG TABS Take 500 mg by mouth daily.     ibuprofen (ADVIL) 600 MG tablet Take 1 tablet (600 mg total) by mouth every 6 (six) hours. (Patient not taking: Reported on 08/14/2021) 30 tablet 0   No current facility-administered medications for this visit.    Review of Systems General: negative for fevers, changes in weight or night sweats Skin: negative for changes in moles or sores or rash Eyes: negative for changes in vision HEENT: negative for change in hearing, tinnitus, voice changes Pulmonary: negative for dyspnea, orthopnea, productive cough, wheezing Cardiac: negative for palpitations, pain Gastrointestinal: negative for nausea, vomiting, constipation, diarrhea, hematemesis, hematochezia Genitourinary/Sexual: negative for dysuria, retention, hematuria. Positive for incontinence Musculoskeletal: negative for pain, joint pain, back pain Hematology: negative for easy bruising, abnormal bleeding Neurologic/Psych: negative for headaches, seizures, paralysis, weakness, numbness  Objective:  Physical Examination:  BP (!) 143/83 (BP Location: Left Arm, Patient Position: Sitting)   Pulse 69   Temp 98 F (36.7 C) (Tympanic)   Resp 18   Wt 229 lb (103.9 kg)   SpO2 98%   BMI 37.24 kg/m   ECOG Performance Status: 0 - Asymptomatic  GENERAL: Patient is a well appearing female in no acute distress HEENT:  PERRL, neck supple with midline trachea. Thyroid without masses.  NODES:  No cervical, supraclavicular, axillary, or inguinal lymphadenopathy palpated.  LUNGS:  Clear to auscultation bilaterally.  No wheezes or rhonchi. HEART:  Regular rate and rhythm. No murmur appreciated. ABDOMEN:  Soft, nontender.  Positive, normoactive bowel sounds. Incisions healing well. MSK:  No focal spinal tenderness to palpation. Full range of motion bilaterally in the upper extremities. EXTREMITIES:  No peripheral edema.   SKIN:  Clear with no obvious rashes or  skin changes. No nail dyscrasia. NEURO:  Nonfocal. Well oriented.  Appropriate affect.  Pelvic  deferred  Small incision on the right buttock healing well.   Lab Review Labs on site today  Radiologic Imaging: 07/17/2021 TRANSABDOMINAL AND TRANSVAGINAL ULTRASOUND OF PELVIS  FINDINGS: Uterus   Measurements: 6.9 x 5.3 x 4.8 cm = volume: 93 mL. Retroverted. No myometrial mass identified.   Endometrium   Thickness: 25 - 28 mm. Thickened, heterogeneous, and indistinct (image 50) with areas of hypovascular cystic change and/or debris in the lower uterine segment (image 45)  superimposed on abnormally hypervascular endometrial tissue near the fundus. The endometrium was also abnormal in 2020.   Right ovary   Measurements: Not identified despite transabdominal and transvaginal attempts.   Left ovary   Measurements: Not identified despite transabdominal and transvaginal attempts.   Other findings   No pelvic free fluid.   IMPRESSION: 1. Abnormally thickened, heterogeneous, and hypervascular endometrium. Endometrial sampling is indicated to exclude carcinoma. AJR 2008; 161:W96-04). 2. Neither ovary could be visualized.  No pelvic free fluid.  ECG     Assessment:  SUZETTA LOTZE is a 71 y.o. female diagnosed with EIN atb least in a polyp on D&C for PMB.  On 09/04/21 underwent robotic TLH, BSO, SLN mapping, appendectomy, resection of small benign right buttocks lesion.  Intra op frozen did not show invasive cancer, so SLNs not removed.  Final pathology showed stage IA grade 1 endometrioid cancer with 8/19 mm (MELF pattern) invasion.  No LVSI, and adnexa, cervix and washings negative.   Appendiceal lesion noted at tip and Dr Everlene Farrier resected appendix. Final path showed LAMIN with negative margins.   Symptomatic skin tag on right buttocks removed and incision healing well.   DM with HbA1c=8  Possible septal infarct based on ECG, asymptomatic  Urinary incontinence,  unspecified  Medical co-morbidities complicating care: Diabetes. Body mass index is 37.24 kg/m Jehovah's witness   Plan:   Problem List Items Addressed This Visit    Patient scheduled for CT scan and follow up with Dr Everlene Farrier regarding appendiceal LAMIN that was resected with clear margins.     We discussed the uterine pathology report and that her risk of recurrence is low with stage IA grade 1 cancer, but SLN staging not done due to frozen section not showing cancer.   We discussed options for further management of her cancer including vaginal brachytherapy vs observation.  She is not inclined to do radiation, but we will discuss at tumor board and see her back for post op check in a few weeks with further discussion  The patient's diagnosis, an outline of the further diagnostic and laboratory studies which will be required, the recommendation, and alternatives were discussed.  All questions were answered to the patient's satisfaction.  Urinary incontinence. Referred to Dr. Florian Buff, Urogynecology pre op for consideration of a joint procedure.  She was seen by Dr Oleh Genin and declined incontinence surgery.     Sue Lauth, MD  CC:  Conard Novak, MD [address] [place],  [place] [address] [phone]

## 2021-09-12 ENCOUNTER — Ambulatory Visit
Admission: RE | Admit: 2021-09-12 | Discharge: 2021-09-12 | Disposition: A | Discharge status: Home / Self Care | Payer: No Typology Code available for payment source | Source: Ambulatory Visit | Attending: Surgery | Admitting: Surgery

## 2021-09-12 DIAGNOSIS — D373 Neoplasm of uncertain behavior of appendix: Secondary | ICD-10-CM | POA: Diagnosis not present

## 2021-09-12 DIAGNOSIS — K760 Fatty (change of) liver, not elsewhere classified: Secondary | ICD-10-CM | POA: Diagnosis not present

## 2021-09-12 DIAGNOSIS — I700 Atherosclerosis of aorta: Secondary | ICD-10-CM | POA: Diagnosis not present

## 2021-09-12 LAB — POCT I-STAT CREATININE: Creatinine, Ser: 0.7 mg/dL (ref 0.44–1.00)

## 2021-09-12 MED ORDER — IOHEXOL 300 MG/ML  SOLN
100.0000 mL | Freq: Once | INTRAMUSCULAR | Status: AC | PRN
  Administered 2021-09-12: 100 mL via INTRAVENOUS

## 2021-09-16 ENCOUNTER — Telehealth: Payer: Self-pay

## 2021-09-16 NOTE — Telephone Encounter (Signed)
Patient notified of CT results. Keep Wednesday appointment. ?

## 2021-09-18 ENCOUNTER — Ambulatory Visit (INDEPENDENT_AMBULATORY_CARE_PROVIDER_SITE_OTHER): Payer: No Typology Code available for payment source | Admitting: Surgery

## 2021-09-18 ENCOUNTER — Encounter: Payer: Self-pay | Admitting: Surgery

## 2021-09-18 ENCOUNTER — Ambulatory Visit
Admission: RE | Admit: 2021-09-18 | Discharge: 2021-09-18 | Disposition: A | Discharge status: Home / Self Care | Payer: No Typology Code available for payment source | Source: Ambulatory Visit | Attending: Surgery | Admitting: Surgery

## 2021-09-18 ENCOUNTER — Other Ambulatory Visit: Payer: Self-pay

## 2021-09-18 VITALS — BP 137/83 | HR 67 | Temp 97.8°F | Ht 65.0 in | Wt 229.8 lb

## 2021-09-18 DIAGNOSIS — D373 Neoplasm of uncertain behavior of appendix: Secondary | ICD-10-CM | POA: Diagnosis not present

## 2021-09-18 DIAGNOSIS — Z1231 Encounter for screening mammogram for malignant neoplasm of breast: Secondary | ICD-10-CM | POA: Insufficient documentation

## 2021-09-18 DIAGNOSIS — Z1239 Encounter for other screening for malignant neoplasm of breast: Secondary | ICD-10-CM | POA: Diagnosis not present

## 2021-09-18 NOTE — Patient Instructions (Addendum)
Referral to Medical Oncology has been sent. Someone from their office will call to schedule an appointment. ? ?Referral to [facility] gastroenterology. Someone from their office will call to schedule an appointment.  ? ? ?Mammogram scheduled for today. Please go there now. Please see your appointment listed below. ? ? ? ? ? ? ? ? ? ? ? ? ?Please call the office if you have any questions. ? ? ?GENERAL POST-OPERATIVE ?PATIENT INSTRUCTIONS  ? ?WOUND CARE INSTRUCTIONS:  Keep a dry clean dressing on the wound if there is drainage. The initial bandage may be removed after 24 hours.  Once the wound has quit draining you may leave it open to air.  If clothing rubs against the wound or causes irritation and the wound is not draining you may cover it with a dry dressing during the daytime.  Try to keep the wound dry and avoid ointments on the wound unless directed to do so.  If the wound becomes bright red and painful or starts to drain infected material that is not clear, please contact your physician immediately.  If the wound is mildly pink and has a thick firm ridge underneath it, this is normal, and is referred to as a healing ridge.  This will resolve over the next 4-6 weeks. ? ?BATHING: ?You may shower if you have been informed of this by your surgeon. However, Please do not submerge in a tub, hot tub, or pool until incisions are completely sealed or have been told by your surgeon that you may do so. ? ?DIET:  You may eat any foods that you can tolerate.  It is a good idea to eat a high fiber diet and take in plenty of fluids to prevent constipation.  If you do become constipated you may want to take a mild laxative or take ducolax tablets on a daily basis until your bowel habits are regular.  Constipation can be very uncomfortable, along with straining, after recent surgery. ? ?ACTIVITY:  You are encouraged to cough and deep breath or use your incentive spirometer if you were given one, every 15-30 minutes when awake.   This will help prevent respiratory complications and low grade fevers post-operatively if you had a general anesthetic.  You may want to hug a pillow when coughing and sneezing to add additional support to the surgical area, if you had abdominal or chest surgery, which will decrease pain during these times.  You are encouraged to walk and engage in light activity for the next two weeks.  You should not lift more than 20 pounds for 6 weeks total after surgery as it could put you at increased risk for complications.  Twenty pounds is roughly equivalent to a plastic bag of groceries. At that time- Listen to your body when lifting, if you have pain when lifting, stop and then try again in a few days. Soreness after doing exercises or activities of daily living is normal as you get back in to your normal routine. ? ?MEDICATIONS:  Try to take narcotic medications and anti-inflammatory medications, such as tylenol, ibuprofen, naprosyn, etc., with food.  This will minimize stomach upset from the medication.  Should you develop nausea and vomiting from the pain medication, or develop a rash, please discontinue the medication and contact your physician.  You should not drive, make important decisions, or operate machinery when taking narcotic pain medication. ? ?SUNBLOCK ?Use sun block to incision area over the next year if this area will be exposed to  sun. This helps decrease scarring and will allow you avoid a permanent darkened area over your incision. ? ?QUESTIONS:  Please feel free to call our office if you have any questions, and we will be glad to assist you. [phone] ? ? ?

## 2021-09-19 ENCOUNTER — Telehealth: Payer: Self-pay

## 2021-09-19 ENCOUNTER — Other Ambulatory Visit: Payer: Self-pay

## 2021-09-19 DIAGNOSIS — Z1211 Encounter for screening for malignant neoplasm of colon: Secondary | ICD-10-CM

## 2021-09-19 MED ORDER — NA SULFATE-K SULFATE-MG SULF 17.5-3.13-1.6 GM/177ML PO SOLN: 1.0000 | Freq: Once | ORAL | 0 refills | Status: AC

## 2021-09-19 NOTE — Telephone Encounter (Signed)
Called Sue Calhoun regarding her referral to medical oncology. She is feeling well post op other than fatigue. Appointment has been arranged with Dr. Janese Banks for 4/3 at 1330. All questions answered and encouraged to call as needed. ?

## 2021-09-19 NOTE — Telephone Encounter (Signed)
Called patient she is ready to schedule ?

## 2021-09-19 NOTE — Progress Notes (Signed)
Gastroenterology Pre-Procedure Review ? ?Request Date: 10/15/2021 ?Requesting Physician: Dr. Marius Ditch ? ?PATIENT REVIEW QUESTIONS: The patient responded to the following health history questions as indicated:   ? ?1. Are you having any GI issues? no ?2. Do you have a personal history of Polyps? no ?3. Do you have a family history of Colon Cancer or Polyps? no ?4. Diabetes Mellitus? no ?5. Joint replacements in the past 12 months?no ?6. Major health problems in the past 3 months?yes (historectomy 2 weeks ago) ?7. Any artificial heart valves, MVP, or defibrillator?no ?   ?MEDICATIONS & ALLERGIES:    ?Patient reports the following regarding taking any anticoagulation/antiplatelet therapy:   ?Plavix, Coumadin, Eliquis, Xarelto, Lovenox, Pradaxa, Brilinta, or Effient? no ?Aspirin? no ? ?Patient confirms/reports the following medications:  ?Current Outpatient Medications  ?Medication Sig Dispense Refill  ? ALPRAZolam (XANAX) 1 MG tablet Take 1 mg by mouth 3 (three) times daily as needed for anxiety.    ? ibuprofen (ADVIL) 600 MG tablet Take 1 tablet (600 mg total) by mouth every 6 (six) hours. 30 tablet 0  ? ondansetron (ZOFRAN-ODT) 4 MG disintegrating tablet Take 1 tablet (4 mg total) by mouth every 6 (six) hours as needed for nausea. 20 tablet 0  ? Spirulina 500 MG TABS Take 500-1,000 mg by mouth daily.    ? ?No current facility-administered medications for this visit.  ? ? ?Patient confirms/reports the following allergies:  ?Allergies  ?Allergen Reactions  ? Nitrofurantoin Macrocrystal Other (See Comments)  ?  Agitation, mood changes, confusion  ? ? ?No orders of the defined types were placed in this encounter. ? ? ?AUTHORIZATION INFORMATION ?Primary Insurance: ?1D#: ?Group #: ? ?Secondary Insurance: ?1D#: ?Group #: ? ?SCHEDULE INFORMATION: ?Date: 10/15/2021 ?Time: ?Location: [facility] ?

## 2021-09-19 NOTE — Telephone Encounter (Signed)
Patient notified of Mammogram results per Dr.Pabon-looks good ?

## 2021-09-20 ENCOUNTER — Encounter: Payer: Self-pay | Admitting: *Deleted

## 2021-09-20 NOTE — Progress Notes (Signed)
Outpatient Surgical Follow Up ? ?09/20/2021 ? ?Sue Calhoun is an 71 y.o. female.  ? ?Chief Complaint  ?Patient presents with  ? Routine Post Op  ?  appendectomy  ? ? 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  She has done well after surgery.  Please note that the pathology was discussed with her in detail.  In addition to that I perform a CT scan of the abdomen and pelvis for staging purposes.  There is no evidence of metastatic disease.  She also had a contained endometrial cancer that should not need any additional therapy. ?I have also arrange for oncology consultation regarding her mucinous neoplasm of the appendix. ?He has never had a colonoscopy before and has been more than 20 years since the last time she had a mammogram. ?She has been eating well with some soreness.  No fevers no chills no nausea no vomiting. ?Gynecology oncology has already seen her.  No acute issues from their end ? ?Past Medical History:  ?Diagnosis Date  ? Anxiety   ? Cancer of appendix ([place])   ? Diabetes mellitus without complication ([place])   ? not started on medication yet  ? Endometrial ca [facility])   ? Endometrial mass   ? ? ?Past Surgical History:  ?Procedure Laterality Date  ? HYSTEROSCOPY WITH D & C N/A 07/25/2021  ? Procedure: DILATATION AND CURETTAGE /HYSTEROSCOPY -REMOVAL OF CERVICAL/ UTERINE MASS;  Surgeon: Will Bonnet, MD;  Location: [facility] ORS;  Service: Gynecology;  Laterality: N/A;  ? NO PAST SURGERIES    ? ROBOTIC ASSISTED TOTAL HYSTERECTOMY WITH BILATERAL SALPINGO OOPHERECTOMY Bilateral 09/04/2021  ? Procedure: XI ROBOTIC ASSISTED TOTAL HYSTERECTOMY WITH BILATERAL SALPINGO OOPHORECTOMY, SENTINEL LYMPH NODE INJECTION MAPPING SKIN TAG REMOVAL; partial cecectomy;  Surgeon: Gillis Ends, MD;  Location: [facility] ORS;  Service: Gynecology;  Laterality:  Bilateral;  ? ? ?Family History  ?Problem Relation Age of Onset  ? Diabetes Mother   ? Cancer Mother 70  ?     thyroid cancer  ? Vision loss Father   ? Diabetes Father   ? Diabetes Sister   ? Skin cancer Sister   ? Diabetes Sister   ? Diabetes Sister   ? Diabetes Sister   ? Diabetes Sister   ? Diabetes Brother   ? Breast cancer Maternal Grandmother 23  ? ? ?Social History:  reports that she has never smoked. She has never used smokeless tobacco. She reports that she does not currently use alcohol. She reports that she does not use drugs. ? ?Allergies:  ?Allergies  ?Allergen Reactions  ? Nitrofurantoin Macrocrystal Other (See Comments)  ?  Agitation, mood changes, confusion  ? ? ?Medications reviewed. ? ? ? ?ROS ?Full ROS performed and is otherwise negative other than what is stated in HPI ? ? ?BP 137/83   Calhoun 66   Temp 97.8 ?F (36.6 ?C) (Oral)   Ht '5\' 5"'$  (1.651 m)   Wt 229 lb 12.8 oz (104.2 kg)   SpO2 98%   BMI 38.24 kg/m?  ? ?Physical Exam ?Vitals and nursing note reviewed. Exam conducted with a chaperone present.  ?Constitutional:   ?   General: She is not in acute distress. ?   Appearance: Normal appearance. She is obese. She is not ill-appearing or toxic-appearing.  ?Eyes:  ?   General:     ?  Left eye: No discharge.  ?   Extraocular Movements: Extraocular movements intact.  ?Cardiovascular:  ?   Rate and Rhythm: Normal rate and regular rhythm.  ?   Heart sounds: No murmur heard. ?Pulmonary:  ?   Effort: Pulmonary effort is normal. No respiratory distress.  ?   Breath sounds: Normal breath sounds. No stridor. No wheezing, rhonchi or rales.  ?Abdominal:  ?   General: There is no distension.  ?   Palpations: Abdomen is soft. There is no mass.  ?   Tenderness: There is no abdominal tenderness. There is no rebound.  ?   Hernia: No hernia is present.  ?   Comments: Incisions healing well without evidence of infection.  No peritonitis  ?Musculoskeletal:     ?   General: No swelling or tenderness. Normal range  of motion.  ?   Cervical back: Normal range of motion and neck supple. No rigidity or tenderness.  ?Skin: ?   General: Skin is warm and dry.  ?   Capillary Refill: Capillary refill takes less than 2 seconds.  ?Neurological:  ?   General: No focal deficit present.  ?   Mental Status: She is alert and oriented to person, place, and time.  ?Psychiatric:     ?   Mood and Affect: Mood normal.     ?   Behavior: Behavior normal.     ?   Thought Content: Thought content normal.     ?   Judgment: Judgment normal.  ? ? ? 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  An extensive discussion with patient regarding her disease process.  She is just recovering from her hysterectomy and wishes to wait more before we do a full right colectomy.  In the meantime I would like to further stage her work-up.  We will obtain labs, oncology consultation, colonoscopy and mammogram. ? ?Please note that I spent more than 40 minutes's encounter including coordination of her care, placing orders, personally reviewing imaging studies.  Please also note that this fell outside the routine surgical follow-up because I needed to perform a full cancer work-up to include additional mammogram, colonoscopy , labs , review of CT scan images and to tailor her medical care.  ? ? ?Caroleen Hamman, MD FACS ?General Surgeon  ?

## 2021-09-23 ENCOUNTER — Encounter: Payer: No Typology Code available for payment source | Admitting: Surgery

## 2021-09-23 NOTE — Telephone Encounter (Signed)
Jonathon Bellows, MD  [facility] [place], [place] ?Dr Dahlia Byes referred needs colonoscopy after 3-4 weeks not sooner  ? ?Dr. Jonathon Bellows ? ?Patient is scheduled to have her colonoscopy on 10/15/2021 with Dr. Vicente Males. ?

## 2021-09-30 ENCOUNTER — Other Ambulatory Visit: Payer: No Typology Code available for payment source

## 2021-09-30 ENCOUNTER — Ambulatory Visit: Payer: No Typology Code available for payment source | Admitting: Oncology

## 2021-10-03 ENCOUNTER — Other Ambulatory Visit: Payer: Self-pay

## 2021-10-03 LAB — SURGICAL PATHOLOGY

## 2021-10-09 ENCOUNTER — Inpatient Hospital Stay
Payer: No Typology Code available for payment source | Attending: Obstetrics and Gynecology | Admitting: Obstetrics and Gynecology

## 2021-10-09 ENCOUNTER — Other Ambulatory Visit: Payer: No Typology Code available for payment source

## 2021-10-09 ENCOUNTER — Telehealth: Payer: Self-pay

## 2021-10-09 VITALS — BP 152/78 | HR 67 | Temp 99.1°F | Resp 16 | Ht 65.0 in | Wt 233.0 lb

## 2021-10-09 DIAGNOSIS — C541 Malignant neoplasm of endometrium: Secondary | ICD-10-CM

## 2021-10-09 DIAGNOSIS — C181 Malignant neoplasm of appendix: Secondary | ICD-10-CM

## 2021-10-09 NOTE — Patient Instructions (Signed)
Please call [facility] to see if they have a cheaper option for your colon prep. [phone] ?

## 2021-10-09 NOTE — Telephone Encounter (Signed)
error 

## 2021-10-09 NOTE — Progress Notes (Signed)
Will send referral for financial assistance. Offered referral to counseling. Defers at this time. ?

## 2021-10-09 NOTE — Progress Notes (Signed)
Tumor Board Documentation ? ?Sue Calhoun was presented by Mariea Clonts, RN at our Tumor Board on 10/09/2021, which included representatives from medical oncology, surgical oncology, pathology, navigation, palliative care. ? 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 ? ?Additionally, we reviewed previous medical and familial history, history of present illness, and recent lab results along with all available histopathologic and imaging studies. The tumor board considered available treatment options and made the following recommendations: ?Active surveillance, Surgery (Referred to medical oncology. Colectomy was recommended/awaiting Dr. Dahlia Byes.) ?  ? ?The following procedures/referrals were also placed: No orders of the defined types were placed in this encounter. ? ? ?Clinical Trial Status: not discussed  ? ?Staging used:   ? ?National site-specific guidelines   were discussed with respect to the case. ? ?Tumor board is a meeting of clinicians from various specialty areas who evaluate and discuss patients for whom a multidisciplinary approach is being considered. Final determinations in the plan of care are those of the provider(s). The responsibility for follow up of recommendations given during tumor board is that of the provider.  ? ?Today?s extended care, comprehensive team conference, Sue Calhoun was not present for the discussion and was not examined.  ? ?

## 2021-10-09 NOTE — Progress Notes (Signed)
Gynecologic Oncology Interval Visit   Referring Provider: Thomasene Mohair, MD  Chief Concern: Endometrial cancer, post op exam.  Subjective:  Sue Calhoun is a 71 y.o. [phone] female who is seen in consultation from Dr. Jean Rosenthal for EIN.  She presents today for her postop visit.   Postop CT 09/15/2021 to assess appendiceal malignancy.  IMPRESSION: Status post appendectomy, hysterectomy, and bilateral oophorectomy. Postsurgical changes within the pelvis. No evidence of recurrent or residual disease within the abdomen and pelvis.   Cholelithiasis.   Mild hepatic steatosis.   Mild distal colonic diverticulosis without superimposed acute inflammatory change  Her case was presented at Case Conference earlier today. Recommendation for active surveillance for endometrial cancer. She has been referred to medical oncology for treatment planning for appendiceal cancer but missed her appt. Surgery including possible colectomy was recommended. She is feeling overwhelmed and elected to have colonoscopy performed first which is scheduled for 10/15/21. She will follow up with Dr. Everlene Farrier following for discussion of colectomy. She also has financial concerns and does not have the money to pay for surgery or her bowel prep.      Gynecologic Oncology History She initially presented with history of postmenopausal bleeding in 2020 and went to the ER for evaluation. Ultrasound noted enlarged endometrial lining on ultrasound. A growth was noted on pelvic exam. She was seen by Dr. Jean Rosenthal who described a ". . . dark-red/brown mass extending from the cervix to the just outside the introitus. The mass is mobile and I could not find any areas to where it might be fixed in the vagina. I was unable to get a finger around the mass to definitively appreciate the cervix. Otherwise, an internal exam was prohibitive due to the mass and body habitus."   07/17/2021 Biopsy of the mass in clinic revealed  BENIGN ENDOMETRIAL   POLYP WITH BREAKDOWN CHANGES. NO HYPERPLASIA OR CARCINOMA.  07/25/2021 she underwent hysteroscopy, dilation and curettage, and Myosure. The previously seen cervical/uterine mass was no longer present.   DIAGNOSIS:  A. 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   B. 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   She has retired but works as Engineer, structural for elderly gentleman.  HbA1c = 8 05/19/21   09/04/21 - underwent robotic TLH, BSO, SLN mapping, appendectomy, resection of small benign right buttocks lesion.  Intra op frozen did not show invasive cancer, so SLNs not removed.  Final pathology showed stage IA endometrioid cancer with 8/19 mm invasion.  No LVSI, and adnexa, cervix and washings negative.   Appendiceal lesion noted at tip and Dr Everlene Farrier resected appendix. Final path showed LAMIN with negative margins.   DIAGNOSIS:  A. SKIN AND SUBCUTANEOUS TISSUE, RIGHT BUTTOCK; EXCISION:  - BENIGN FIBROEPITHELIAL POLYP.  - NEGATIVE FOR ATYPIA AND MALIGNANCY.   B. UTERUS WITH CERVIX, BILATERAL FALLOPIAN TUBES AND OVARIES; TOTAL  HYSTERECTOMY WITH BILATERAL SALPINGO-OOPHORECTOMY:  - ENDOMETRIUM:       - ENDOMETRIOID ADENOCARCINOMA, LOW GRADE (FIGO 1), WITH FOCI OF  MICROCYSTIC, ELONGATED, AND FRAGMENTED (MELF) PATTERN OF INVASION.       - EXTENSIVE ENDOMETRIAL INTRAEPITHELIAL NEOPLASIA (ATYPICAL  HYPERPLASIA/EIN).       - SEE CANCER SUMMARY.  - MYOMETRIUM:       - ADENOMYOSIS, WITH INVOLVEMENT BY ATYPICAL HYPERPLASIA/EIN.       - LEIOMYOMATA UTERI.       - NEGATIVE FOR FEATURES OF MALIGNANCY.  -  UTERINE CERVIX:       - BENIGN TRANSFORMATION ZONE DISPLAYING ULCERATION AND GRANULATION  TISSUE TYPE CHANGES.       - NEGATIVE FOR SQUAMOUS INTRAEPITHELIAL LESION AND MALIGNANCY.  - UTERINE SEROSA:        - ENDOMETRIOSIS.       - NEGATIVE FOR ATYPICAL HYPERPLASIA/EIN AND MALIGNANCY.  - FALLOPIAN TUBES:       - NO SIGNIFICANT HISTOPATHOLOGIC CHANGE.  - OVARIES:       - BENIGN SEROUS CYSTADENOMAS, LEFT OVARY.       - FOCAL ENDOMETRIOSIS, LEFT OVARY.       - BENIGN PHYSIOLOGIC CHANGES.       - NEGATIVE FOR MALIGNANCY.   Comment:  Multiple additional deeper recut levels were examined for selected  blocks. The entire endometrial surface is submitted for histologic  evaluation. This case is reviewed together with the previous biopsy  ([id number]). The majority of the endometrial surface displays atypical  hyperplasia/EIN. However, one nodule shows areas of cribriform growth  and mucinous differentiation, as well as numerous small, microcystic  glands, with areas of epithelial attenuation and inflammatory response  at the epithelial-mesenchymal interface, compatible with a "MELF"  pattern of invasion.   Tumor Size: Greatest dimension: 0.8 x 0.8 cm  Histologic Type: Endometrioid carcinoma, NOS  Histologic Grade: Low-grade (FIGO 1)  Myometrial Invasion: Present       Depth of invasion (millimeters): 8 mm       Myometrial thickness (millimeters): 19 mm       Percentage of myometrial invasion: 42%  Uterine Serosa Involvement: Not identified  Cervical Stromal Involvement: Not identified  Other Tissue/Organ Involvement: Not identified  Peritoneal/Ascitic Fluid: Malignant cells not identified  Lymphatic and/or Vascular Invasion: Not identified   C. APPENDIX; APPENDECTOMY:  - LOW-GRADE APPENDICEAL MUCINOUS NEOPLASM, WITH ACELLULAR MUCIN PRESENT  AT SEROSAL SURFACE.   Tumor site: Distal half of appendix  Histologic type: Low-grade appendiceal mucinous neoplasm  Histologic Grade: G1, well-differentiated  Tumor Size: 3.3 x 3.1 x 2.4 cm  Tumor Deposits: Not identified  Tumor Extent: Acellular mucin invades visceral peritoneum (serosa)  Lymphovascular Invasion: Not identified   Problem  List: Patient Active Problem List   Diagnosis Date Noted   Endometrial mass 07/22/2018   Postmenopausal bleeding 07/01/2018   Skin lesion of right lower extremity 07/01/2018   Anxiety 11/17/2017   Depression 11/17/2017   Class 2 obesity due to excess calories without serious comorbidity with body mass index (BMI) of 37.0 to 37.9 in adult 12/09/2012   Foot pain 06/10/2012    Past Medical History: Past Medical History:  Diagnosis Date   Anxiety    Diabetes mellitus without complication (HCC)    not started on medication yet   Endometrial mass     Past Surgical History: Past Surgical History:  Procedure Laterality Date   HYSTEROSCOPY WITH D & C N/A 07/25/2021   Procedure: DILATATION AND CURETTAGE /HYSTEROSCOPY -REMOVAL OF CERVICAL/ UTERINE MASS;  Surgeon: Conard Novak, MD;  Location: [facility] ORS;  Service: Gynecology;  Laterality: N/A;   NO PAST SURGERIES      Past Gynecologic History:  Menarche: age 16.  Post menopausal Hx of Chlamydia in April 1988.    OB History:  OB History  Gravida Para Term Preterm AB Living  6 1 1   5 1   SAB IAB Ectopic Multiple Live Births  5       1    # Outcome Date GA Lbr Len/2nd Weight Sex Delivery  Anes PTL Lv  6 SAB           5 Term           4 SAB           3 SAB           2 SAB           1 SAB             Obstetric Comments  1 vaginal delivery    Family History: Family History  Problem Relation Age of Onset   Diabetes Mother    Cancer Mother 40       thyroid cancer   Diabetes Father    Diabetes Sister    Skin cancer Sister    Diabetes Sister    Diabetes Sister    Diabetes Sister    Diabetes Sister    Diabetes Brother    Breast cancer Maternal Grandmother 32    Social History: Social History   Socioeconomic History   Marital status: Single    Spouse name: Not on file   Number of children: 1   Years of education: Not on file   Highest education level: Not on file  Occupational History   Not on file  Tobacco  Use   Smoking status: Never   Smokeless tobacco: Never  Vaping Use   Vaping Use: Never used  Substance and Sexual Activity   Alcohol use: Not Currently   Drug use: Never   Sexual activity: Not Currently    Birth control/protection: Post-menopausal  Other Topics Concern   Not on file  Social History Narrative   Lives with son. She works as a Engineer, structural to an elderly gentleman. Helps to take care of her 5 sisters, all of whom are disabled.    Social Determinants of Health   Financial Resource Strain: Not on file  Food Insecurity: Not on file  Transportation Needs: Not on file  Physical Activity: Not on file  Stress: Not on file  Social Connections: Not on file  Intimate Partner Violence: Not on file    Allergies: No Known Allergies  Current Medications: Current Outpatient Medications  Medication Sig Dispense Refill   ALPRAZolam (XANAX) 1 MG tablet Take 1 mg by mouth 3 (three) times daily as needed for anxiety.     Spirulina 500 MG TABS Take 500 mg by mouth daily.     ibuprofen (ADVIL) 600 MG tablet Take 1 tablet (600 mg total) by mouth every 6 (six) hours. (Patient not taking: Reported on 08/14/2021) 30 tablet 0   No current facility-administered medications for this visit.    Review of Systems General: no complaints  HEENT: no complaints  Lungs: no complaints  Cardiac: no complaints  GI: no complaints  GU: no complaints  Musculoskeletal: no complaints  Extremities: no complaints  Skin: no complaints  Neuro: no complaints  Endocrine: no complaints  Psych: no complaints      Objective:  Physical Examination:  BP (!) 143/83 (BP Location: Left Arm, Patient Position: Sitting)   Pulse 69   Temp 98 F (36.7 C) (Tympanic)   Resp 18   Wt 229 lb (103.9 kg)   SpO2 98%   BMI 37.24 kg/m   ECOG Performance Status: 0 - Asymptomatic  GENERAL: Patient is a well appearing female in no acute distress HEENT:  PERRL, neck supple with midline trachea. Thyroid without  masses.  ABDOMEN:  Soft, nontender.  Positive, normoactive bowel sounds. Incisions all well  healed. No hernia/ascites/masses EXTREMITIES:  No peripheral edema.   SKIN:  Clear with no obvious rashes or skin changes. No nail dyscrasia. NEURO:  Nonfocal. Well oriented.  Appropriate affect.  Pelvic: EGBUS: no lesions Cervix: absent Vagina: no lesions, no discharge or bleeding Uterus: absent Adnexa: no palpable masses Rectovaginal: deferred  Perianal wound site incision healed. There is a suture still present. The area appears bruised. No discharge.    Lab Review N/A   Radiologic Imaging: 07/17/2021 TRANSABDOMINAL AND TRANSVAGINAL ULTRASOUND OF PELVIS  FINDINGS: Uterus   Measurements: 6.9 x 5.3 x 4.8 cm = volume: 93 mL. Retroverted. No myometrial mass identified.   Endometrium   Thickness: 25 - 28 mm. Thickened, heterogeneous, and indistinct (image 50) with areas of hypovascular cystic change and/or debris in the lower uterine segment (image 45)  superimposed on abnormally hypervascular endometrial tissue near the fundus. The endometrium was also abnormal in 2020.   Right ovary   Measurements: Not identified despite transabdominal and transvaginal attempts.   Left ovary   Measurements: Not identified despite transabdominal and transvaginal attempts.   Other findings   No pelvic free fluid.   IMPRESSION: 1. Abnormally thickened, heterogeneous, and hypervascular endometrium. Endometrial sampling is indicated to exclude carcinoma. AJR 2008; 161:W96-04). 2. Neither ovary could be visualized.  No pelvic free fluid.  ECG     Assessment:  Sue Calhoun is a 71 y.o. female diagnosed with EIN atb least in a polyp on D&C for PMB.  On 09/04/21 underwent robotic TLH, BSO, SLN mapping, appendectomy, resection of small benign right buttocks lesion.  Intra op frozen did not show invasive cancer, so SLNs not removed.  Final pathology showed stage IA grade 1 endometrioid cancer with 8/19  mm (MELF pattern) invasion.  No LVSI, and adnexa, cervix and washings negative.   Appendiceal lesion noted at tip and Dr Everlene Farrier resected appendix. Final path showed LAMIN with negative margins. Colonoscopy and appt with Dr. Smith Robert pending.   Symptomatic skin tag on right buttocks removed and incision healing well.   DM, poorly controlled.   Possible septal infarct based on ECG, asymptomatic, follow up with PCP  Cholelithiasis and diverticulosis, asymptomatic   Urinary incontinence, unspecified  Medical co-morbidities complicating care: Diabetes. Jehovah's witness. Body mass index is 38.77 kg/m.   Plan:   Endometrial cancer (HCC)  Cancer of appendix (HCC)   Follow up with Drs. Smith Robert and Pabon regarding appendiceal LAMIN that was resected with clear margins.     Previously Dr. Johnnette Litter discussed the uterine pathology report and that her risk of recurrence is low with stage IA grade 1 cancer, but SLN staging not done due to frozen section not showing cancer.   We discussed options for further management of her cancer including vaginal brachytherapy vs observation.  She is not inclined to do radiation. Tumor board today discussed VBT versus observation. Given the current concerns she has regarding financial toxicity and the small size of the tumor plan for observation.   The patient's diagnosis, an outline of the further diagnostic and laboratory studies which will be required, the recommendation, and alternatives were discussed.  All questions were answered to the patient's satisfaction.  Urinary incontinence. Referred to Dr. Florian Buff, Urogynecology pre op for consideration of a joint procedure.  She was seen by Dr Oleh Genin and declined incontinence surgery.  Follow up with her PCP regarding ECG findings, cholelithiasis and diverticulosis as well as other medical issues.   I have recommended continued close follow up with exams, including  pelvic exams every 4-6 months for 5 years and then  annually thereafter.  We can alternate visits with Dr. Jean Rosenthal. Imaging and laboratory assessment is based on clinical indication. Patient education for obesity, lifestyle, exercise, nutrition, sexual health. We discussed her weight and need for weight loss, stressed good nutrition, and exercise. She is aware and this is an issues she has struggled with in the past. She is not a smoker and not sexually active.       I personally had a face to face interaction and evaluated the patient jointly with the NP, Ms. Consuello Masse.  I have reviewed her history and available records and have performed the key portions of the physical exam including abdominal exam, pelvic exam with my findings confirming those documented above by the APP and me.  I have discussed the case with the APP and the patient.  I agree with the above documentation, assessment and plan which was fully formulated by me.  Counseling was completed by me.   I personally saw the patient and performed a substantive portion of this encounter in conjunction with the listed APP as documented above.  Sue Coverdale Leta Jungling, MD    CC:  Conard Novak, MD [address] [place],  [place] [address] [phone]

## 2021-10-14 ENCOUNTER — Telehealth: Payer: Self-pay

## 2021-10-14 ENCOUNTER — Encounter: Payer: Self-pay | Admitting: Gastroenterology

## 2021-10-14 NOTE — Telephone Encounter (Signed)
Received call from Ms. Magloire with several questions regarding colonoscopy tomorrow. Questions answered and copy of the letter sent to her MyChart from GI with the instructions was emailed to her. ?

## 2021-10-15 ENCOUNTER — Ambulatory Visit
Admission: RE | Admit: 2021-10-15 | Discharge: 2021-10-15 | Disposition: A | Discharge status: Home / Self Care | Payer: No Typology Code available for payment source | Attending: Gastroenterology | Admitting: Gastroenterology

## 2021-10-15 ENCOUNTER — Ambulatory Visit: Payer: No Typology Code available for payment source | Admitting: Certified Registered"

## 2021-10-15 ENCOUNTER — Encounter: Payer: Self-pay | Admitting: Gastroenterology

## 2021-10-15 ENCOUNTER — Encounter
Admission: RE | Disposition: A | Discharge status: Home / Self Care | Payer: Self-pay | Source: Home / Self Care | Attending: Gastroenterology

## 2021-10-15 DIAGNOSIS — Z1211 Encounter for screening for malignant neoplasm of colon: Secondary | ICD-10-CM | POA: Diagnosis not present

## 2021-10-15 DIAGNOSIS — K5730 Diverticulosis of large intestine without perforation or abscess without bleeding: Secondary | ICD-10-CM | POA: Diagnosis not present

## 2021-10-15 DIAGNOSIS — Z85038 Personal history of other malignant neoplasm of large intestine: Secondary | ICD-10-CM | POA: Insufficient documentation

## 2021-10-15 DIAGNOSIS — D123 Benign neoplasm of transverse colon: Secondary | ICD-10-CM | POA: Diagnosis not present

## 2021-10-15 DIAGNOSIS — D122 Benign neoplasm of ascending colon: Secondary | ICD-10-CM

## 2021-10-15 DIAGNOSIS — F419 Anxiety disorder, unspecified: Secondary | ICD-10-CM | POA: Diagnosis not present

## 2021-10-15 DIAGNOSIS — D373 Neoplasm of uncertain behavior of appendix: Secondary | ICD-10-CM | POA: Diagnosis not present

## 2021-10-15 DIAGNOSIS — K635 Polyp of colon: Secondary | ICD-10-CM | POA: Diagnosis not present

## 2021-10-15 HISTORY — PX: COLONOSCOPY WITH PROPOFOL: SHX5780

## 2021-10-15 SURGERY — COLONOSCOPY WITH PROPOFOL
Anesthesia: General

## 2021-10-15 MED ORDER — PROPOFOL 500 MG/50ML IV EMUL
INTRAVENOUS | Status: AC
  Filled 2021-10-15: qty 50

## 2021-10-15 MED ORDER — PROPOFOL 500 MG/50ML IV EMUL
INTRAVENOUS | Status: DC | PRN
Start: 2021-10-15 — End: 2021-10-15
  Administered 2021-10-15: 75 ug/kg/min via INTRAVENOUS

## 2021-10-15 MED ORDER — SODIUM CHLORIDE 0.9 % IV SOLN: INTRAVENOUS | Status: DC

## 2021-10-15 MED ORDER — LIDOCAINE HCL (CARDIAC) PF 100 MG/5ML IV SOSY
PREFILLED_SYRINGE | INTRAVENOUS | Status: DC | PRN
  Administered 2021-10-15: 40 mg via INTRAVENOUS

## 2021-10-15 MED ORDER — PROPOFOL 10 MG/ML IV BOLUS
INTRAVENOUS | Status: DC | PRN
  Administered 2021-10-15 (×2): 50 mg via INTRAVENOUS
  Administered 2021-10-15: 40 mg via INTRAVENOUS

## 2021-10-15 MED ORDER — STERILE WATER FOR IRRIGATION IR SOLN
Status: DC | PRN
  Administered 2021-10-15: 120 mL

## 2021-10-15 NOTE — Anesthesia Preprocedure Evaluation (Signed)
Anesthesia Evaluation  ?Patient identified by MRN, date of birth, ID band ?Patient awake ? ? ? ?Reviewed: ?Allergy & Precautions, H&P , NPO status , Patient's Chart, lab work & pertinent test results, reviewed documented beta blocker date and time  ? ?Airway ?Mallampati: III ? ? ?Neck ROM: full ? ? ? Dental ? ?(+) Poor Dentition, Teeth Intact ?  ?Pulmonary ?neg pulmonary ROS,  ?  ?Pulmonary exam normal ? ? ? ? ? ? ? Cardiovascular ?Exercise Tolerance: Poor ?negative cardio ROS ?Normal cardiovascular exam ?Rhythm:regular Rate:Normal ? ? ?  ?Neuro/Psych ?PSYCHIATRIC DISORDERS Anxiety Depression negative neurological ROS ?   ? GI/Hepatic ?negative GI ROS, Neg liver ROS,   ?Endo/Other  ?diabetes, Well Controlled, Type 2, Oral Hypoglycemic AgentsMorbid obesity ? Renal/GU ?negative Renal ROS  ?negative genitourinary ?  ?Musculoskeletal ? ? Abdominal ?  ?Peds ? Hematology ?negative hematology ROS ?(+)   ?Anesthesia Other Findings ?Past Medical History: ?No date: Anxiety ?No date: Cancer of appendix [facility]) ?No date: Diabetes mellitus without complication ([place]) ?    Comment:  not started on medication yet ?No date: Endometrial ca [facility]) ?No date: Endometrial mass ?Past Surgical History: ?No date: APPENDECTOMY ?07/25/2021: HYSTEROSCOPY WITH D & C; N/A ?    Comment:  Procedure: DILATATION AND CURETTAGE /HYSTEROSCOPY  ?             -REMOVAL OF CERVICAL/ UTERINE MASS;  Surgeon: Glennon Mac,  ?             Estill Bamberg, MD;  Location: [facility] ORS;  Service: Gynecology; ?             Laterality: N/A; ?No date: NO PAST SURGERIES ?09/04/2021: ROBOTIC ASSISTED TOTAL HYSTERECTOMY WITH BILATERAL  ?SALPINGO OOPHERECTOMY; Bilateral ?    Comment:  Procedure: XI ROBOTIC ASSISTED TOTAL HYSTERECTOMY WITH  ?             BILATERAL SALPINGO OOPHORECTOMY, SENTINEL LYMPH NODE  ?             INJECTION MAPPING SKIN TAG REMOVAL; partial cecectomy;   ?             Surgeon: Gillis Ends, MD;  Location: [facility]  ?              ORS;  Service: Gynecology;  Laterality: Bilateral; ? ? Reproductive/Obstetrics ?negative OB ROS ? ?  ? ? ? ? ? ? ? ? ? ? ? ? ? ?  ?  ? ? ? ? ? ? ? ? ?Anesthesia Physical ?Anesthesia Plan ? ?ASA: 3 ? ?Anesthesia Plan: General  ? ?Post-op Pain Management:   ? ?Induction:  ? ?PONV Risk Score and Plan:  ? ?Airway Management Planned:  ? ?Additional Equipment:  ? ?Intra-op Plan:  ? ?Post-operative Plan:  ? ?Informed Consent: I have reviewed the patients History and Physical, chart, labs and discussed the procedure including the risks, benefits and alternatives for the proposed anesthesia with the patient or authorized representative who has indicated his/her understanding and acceptance.  ? ? ? ?Dental Advisory Given ? ?Plan Discussed with: CRNA ? ?Anesthesia Plan Comments:   ? ? ? ? ? ? ?Anesthesia Quick Evaluation ? ?

## 2021-10-15 NOTE — Op Note (Signed)
[facility] ?Gastroenterology ?Patient Name: Sue Calhoun ?Procedure Date: 10/15/2021 8:25 AM ?MRN: [id number] ?Account #: [id number] ?Date of Birth: Jan 26, 1951 ?Admit Type: Outpatient ?Age: 71 ?Room: [facility] ENDO ROOM 3 ?Gender: Female ?Note Status: Finalized ?Instrument Name: Colonoscope [id number] ?Procedure:             Colonoscopy ?Indications:           This is the patient's first colonoscopy, High risk  ?                       colon cancer surveillance: Personal history of colon  ?                       cancer ?Providers:             Lin Landsman MD, MD ?Referring MD:          [facility] ?Medicines:             General Anesthesia ?Complications:         No immediate complications. Estimated blood loss: None. ?Procedure:             Pre-Anesthesia Assessment: ?                       - Prior to the procedure, a History and Physical was  ?                       performed, and patient medications and allergies were  ?                       reviewed. The patient is competent. The risks and  ?                       benefits of the procedure and the sedation options and  ?                       risks were discussed with the patient. All questions  ?                       were answered and informed consent was obtained.  ?                       Patient identification and proposed procedure were  ?                       verified by the physician, the nurse, the  ?                       anesthesiologist, the anesthetist and the technician  ?                       in the pre-procedure area in the procedure room in the  ?                       endoscopy suite. Mental Status Examination: alert and  ?                       oriented. Airway Examination: normal oropharyngeal  ?  airway and neck mobility. Respiratory Examination:  ?                       clear to auscultation. CV Examination: normal.  ?                       Prophylactic Antibiotics: The patient does not require  ?                        prophylactic antibiotics. Prior Anticoagulants: The  ?                       patient has taken no previous anticoagulant or  ?                       antiplatelet agents. ASA Grade Assessment: III - A  ?                       patient with severe systemic disease. After reviewing  ?                       the risks and benefits, the patient was deemed in  ?                       satisfactory condition to undergo the procedure. The  ?                       anesthesia plan was to use general anesthesia.  ?                       Immediately prior to administration of medications,  ?                       the patient was re-assessed for adequacy to receive  ?                       sedatives. The heart rate, respiratory rate, oxygen  ?                       saturations, blood pressure, adequacy of pulmonary  ?                       ventilation, and response to care were monitored  ?                       throughout the procedure. The physical status of the  ?                       patient was re-assessed after the procedure. ?                       After obtaining informed consent, the colonoscope was  ?                       passed under direct vision. Throughout the procedure,  ?                       the patient's blood pressure, pulse, and oxygen  ?  saturations were monitored continuously. The  ?                       Colonoscope was introduced through the anus and  ?                       advanced to the the terminal ileum, with  ?                       identification of the appendiceal orifice and IC  ?                       valve. The colonoscopy was performed without  ?                       difficulty. The patient tolerated the procedure well.  ?                       The quality of the bowel preparation was evaluated  ?                       using the BBPS [facility] Bowel Preparation Scale) with  ?                       scores of: Right Colon = 3, Transverse Colon = 3 and  ?                        Left Colon = 3 (entire mucosa seen well with no  ?                       residual staining, small fragments of stool or opaque  ?                       liquid). The total BBPS score equals 9. ?Findings: ?     The perianal and digital rectal examinations were normal. Pertinent  ?     negatives include normal sphincter tone and no palpable rectal lesions. ?     Two sessile polyps were found in the ascending colon. The polyps were  ?     diminutive in size. These polyps were removed with a jumbo cold forceps.  ?     Resection and retrieval were complete. Estimated blood loss: none. ?     A 5 mm polyp was found in the transverse colon. The polyp was sessile.  ?     The polyp was removed with a cold snare. Resection and retrieval were  ?     complete. Estimated blood loss: none. ?     Multiple diverticula were found in the recto-sigmoid colon and sigmoid  ?     colon. ?     The retroflexed view of the distal rectum and anal verge was normal and  ?     showed no anal or rectal abnormalities. ?     The terminal ileum appeared normal. ?Impression:            - Two diminutive polyps in the ascending colon,  ?                       removed with a jumbo cold forceps. Resected and  ?  retrieved. ?                       - One 5 mm polyp in the transverse colon, removed with  ?                       a cold snare. Resected and retrieved. ?                       - Diverticulosis in the recto-sigmoid colon and in the  ?                       sigmoid colon. ?                       - The distal rectum and anal verge are normal on  ?                       retroflexion view. ?                       - The examined portion of the ileum was normal. ?Recommendation:        - Discharge patient to home (with escort). ?                       - Resume previous diet today. ?                       - Continue present medications. ?                       - Await pathology results. ?                       -  Repeat colonoscopy in 3 - 5 years for surveillance  ?                       based on pathology results. ?Procedure Code(s):     --- Professional --- ?                       [phone], Colonoscopy, flexible; with removal of  ?                       tumor(s), polyp(s), or other lesion(s) by snare  ?                       technique ?                       45380, 59, Colonoscopy, flexible; with biopsy, single  ?                       or multiple ?Diagnosis Code(s):     --- Professional --- ?                       K63.5, Polyp of colon ?                       Z85.038, Personal history of other malignant neoplasm  ?  of large intestine ?                       K57.30, Diverticulosis of large intestine without  ?                       perforation or abscess without bleeding ?CPT copyright 2019 American Medical Association. All rights reserved. ?The codes documented in this report are preliminary and upon coder review may  ?be revised to meet current compliance requirements. ?Dr. Ulyess Mort ?Severn Goddard Raeanne Gathers MD, MD ?10/15/2021 9:11:00 AM ?This report has been signed electronically. ?Number of Addenda: 0 ?Note Initiated On: 10/15/2021 8:25 AM ?Scope Withdrawal Time: 0 hours 15 minutes 36 seconds  ?Total Procedure Duration: 0 hours 20 minutes 9 seconds  ?Estimated Blood Loss:  Estimated blood loss: none. ?     [facility] ?

## 2021-10-15 NOTE — Transfer of Care (Signed)
Immediate Anesthesia Transfer of Care Note ? ?Patient: Sue Calhoun ? ?Procedure(s) Performed: COLONOSCOPY WITH PROPOFOL ? ?Patient Location: PACU ? ?Anesthesia Type:General ? ?Level of Consciousness: awake and drowsy ? ?Airway & Oxygen Therapy: Patient Spontanous Breathing ? ?Post-op Assessment: Report given to RN and Post -op Vital signs reviewed and stable ? ?Post vital signs: Reviewed and stable ? ?Last Vitals:  ?Vitals Value Taken Time  ?BP 105/84 10/15/21 0915  ?Temp 35.7 ?C 10/15/21 0913  ?Pulse 62 10/15/21 0915  ?Resp 16 10/15/21 0915  ?SpO2 99 % 10/15/21 0915  ? ? ?Last Pain:  ?Vitals:  ? 10/15/21 0913  ?TempSrc: Tympanic  ?PainSc: 0-No pain  ?   ? ?  ? ?Complications: No notable events documented. ?

## 2021-10-15 NOTE — Anesthesia Postprocedure Evaluation (Signed)
Anesthesia Post Note ? ?Patient: Sue Calhoun ? ?Procedure(s) Performed: COLONOSCOPY WITH PROPOFOL ? ?Patient location during evaluation: PACU ?Anesthesia Type: General ?Level of consciousness: awake and alert ?Pain management: pain level controlled ?Vital Signs Assessment: post-procedure vital signs reviewed and stable ?Respiratory status: spontaneous breathing, nonlabored ventilation, respiratory function stable and patient connected to nasal cannula oxygen ?Cardiovascular status: blood pressure returned to baseline and stable ?Postop Assessment: no apparent nausea or vomiting ?Anesthetic complications: no ? ? ?No notable events documented. ? ? ?Last Vitals:  ?Vitals:  ? 10/15/21 0915 10/15/21 0925  ?BP: 105/84 (!) 145/70  ?Pulse: 62 (!) 52  ?Resp: 16 18  ?Temp:    ?SpO2: 99% 99%  ?  ?Last Pain:  ?Vitals:  ? 10/15/21 0913  ?TempSrc: Tympanic  ?PainSc: 0-No pain  ? ? ?  ?  ?  ?  ?  ?  ? ?Molli Barrows ? ? ? ? ?

## 2021-10-15 NOTE — Anesthesia Procedure Notes (Signed)
Date/Time: 10/15/2021 8:35 AM ?Performed by: Johnna Acosta, CRNA ?Pre-anesthesia Checklist: Patient identified, Emergency Drugs available, Suction available, Patient being monitored and Timeout performed ?Patient Re-evaluated:Patient Re-evaluated prior to induction ?Oxygen Delivery Method: Nasal cannula ?Preoxygenation: Pre-oxygenation with 100% oxygen ?Induction Type: IV induction ? ? ? ? ?

## 2021-10-15 NOTE — H&P (Signed)
?Sue Darby, MD ?[address]  ?[address]  ?[place], [place] [address]  ?Main: [phone]  ?Fax: [phone] ?Pager: [phone] ? ?Primary Care Physician:  Sue Calhoun ?Primary Gastroenterologist:  Dr. Cephas Calhoun ? ?Pre-Procedure History & Physical: ?HPI:  Sue Calhoun is a 71 y.o. female is here for an colonoscopy. ?  ?Past Medical History:  ?Diagnosis Date  ? Anxiety   ? Cancer of appendix ([place])   ? Diabetes mellitus without complication ([place])   ? not started on medication yet  ? Endometrial ca [facility])   ? Endometrial mass   ? ? ?Past Surgical History:  ?Procedure Laterality Date  ? APPENDECTOMY    ? HYSTEROSCOPY WITH D & C N/A 07/25/2021  ? Procedure: DILATATION AND CURETTAGE /HYSTEROSCOPY -REMOVAL OF CERVICAL/ UTERINE MASS;  Surgeon: Sue Bonnet, MD;  Location: [facility] ORS;  Service: Gynecology;  Laterality: N/A;  ? NO PAST SURGERIES    ? ROBOTIC ASSISTED TOTAL HYSTERECTOMY WITH BILATERAL SALPINGO OOPHERECTOMY Bilateral 09/04/2021  ? Procedure: XI ROBOTIC ASSISTED TOTAL HYSTERECTOMY WITH BILATERAL SALPINGO OOPHORECTOMY, SENTINEL LYMPH NODE INJECTION MAPPING SKIN TAG REMOVAL; partial cecectomy;  Surgeon: Sue Ends, MD;  Location: [facility] ORS;  Service: Gynecology;  Laterality: Bilateral;  ? ? ?Prior to Admission medications   ?Medication Sig Start Date End Date Taking? Authorizing Provider  ?ALPRAZolam (XANAX) 1 MG tablet Take 1 mg by mouth 3 (three) times daily as needed for anxiety. 06/24/17   [provider]  ?ibuprofen (ADVIL) 600 MG tablet Take 1 tablet (600 mg total) by mouth every 6 (six) hours. ?Patient not taking: Reported on 10/09/2021 09/04/21   Sue Bonnet, MD  ?ondansetron (ZOFRAN-ODT) 4 MG disintegrating tablet Take 1 tablet (4 mg total) by mouth every 6 (six) hours as needed for nausea. ?Patient not taking: Reported on 10/09/2021 09/04/21   Sue Bonnet, MD  ?Spirulina 500 MG TABS Take 500-1,000 mg by mouth daily.    [provider]   ? ? ?Allergies as of 09/19/2021 - Review Complete 09/19/2021  ?Allergen Reaction Noted  ? Nitrofurantoin macrocrystal Other (See Comments) 08/26/2021  ? ? ?Family History  ?Problem Relation Age of Onset  ? Diabetes Mother   ? Cancer Mother 52  ?     thyroid cancer  ? Vision loss Father   ? Diabetes Father   ? Diabetes Sister   ? Skin cancer Sister   ? Diabetes Sister   ? Diabetes Sister   ? Diabetes Sister   ? Diabetes Sister   ? Diabetes Brother   ? Breast cancer Maternal Grandmother 34  ? ? ?Social History  ? ?Socioeconomic History  ? Marital status: Single  ?  Spouse name: Not on file  ? Number of children: 1  ? Years of education: Not on file  ? Highest education level: Not on file  ?Occupational History  ? Not on file  ?Tobacco Use  ? Smoking status: Never  ? Smokeless tobacco: Never  ?Vaping Use  ? Vaping Use: Never used  ?Substance and Sexual Activity  ? Alcohol use: Not Currently  ? Drug use: Never  ? Sexual activity: Not Currently  ?  Birth control/protection: Post-menopausal  ?Other Topics Concern  ? Not on file  ?Social History Narrative  ? Lives with son. 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 Helps to take care of her 5 sisters, all of whom are disabled.   ? ?Social Determinants of Health  ? ?Financial Resource Strain:  Not on file  ?Food Insecurity: Not on file  ?Transportation Needs: Not on file  ?Physical Activity: Not on file  ?Stress: Not on file  ?Social Connections: Not on file  ?Intimate Partner Violence: Not on file  ? ? ?Review of Systems: ?See HPI, otherwise negative ROS ? ?Physical Exam: ?BP 136/66   Pulse 63   Temp (!) 97.4 ?F (36.3 ?C) (Temporal)   Resp 16   Ht '5\' 5"'$  (1.651 m)   Wt 105.7 kg   SpO2 99%   BMI 38.77 kg/m?  ?General:   Alert,  pleasant and cooperative in NAD ?Head:  Normocephalic and atraumatic. ?Neck:  Supple; no masses or thyromegaly. ?Lungs:  Clear throughout to auscultation.    ?Heart:  Regular rate and rhythm. ?Abdomen:  Soft, nontender and nondistended.  Normal bowel sounds, without guarding, and without rebound.   ?Neurologic:  Alert and  oriented x4;  grossly normal neurologically. ? ?Impression/Plan: ?Sue Calhoun is here for an colonoscopy to be performed for colon cancer screening ? ?Risks, benefits, limitations, and alternatives regarding  colonoscopy have been reviewed with the patient.  Questions have been answered.  All parties agreeable. ? ? ?Sue Sear, MD  10/15/2021, 8:29 AM ?

## 2021-10-16 ENCOUNTER — Encounter: Payer: Self-pay | Admitting: Gastroenterology

## 2021-10-17 ENCOUNTER — Telehealth: Payer: Self-pay

## 2021-10-17 LAB — SURGICAL PATHOLOGY

## 2021-10-17 NOTE — Telephone Encounter (Signed)
Called pt to confirm appointment for tomorrow and per pt she believes she does not need this appointment due to having her appendix out and she is declining surgery due to no new finding on CT. States that she did not want to come tomorrow because she is currently in [place] taking care of her sister who is unable to be alone.  I advised pt that I am not able to make that call for her whether to keep her appointment or not. I did suggest that she can go see Dr. Dahlia Byes on Tuesday and if she decided that she still needs [place] she can give [place] a call and we will reschedule for her, we have been scehduling out new patients a week or two at the latest. Pt was very thankful and appreciative.  ?

## 2021-10-18 ENCOUNTER — Encounter: Payer: Self-pay | Admitting: Gastroenterology

## 2021-10-18 ENCOUNTER — Inpatient Hospital Stay: Payer: No Typology Code available for payment source | Admitting: Oncology

## 2021-10-18 ENCOUNTER — Inpatient Hospital Stay: Payer: No Typology Code available for payment source

## 2021-10-21 ENCOUNTER — Ambulatory Visit (INDEPENDENT_AMBULATORY_CARE_PROVIDER_SITE_OTHER): Payer: No Typology Code available for payment source | Admitting: Surgery

## 2021-10-21 ENCOUNTER — Encounter: Payer: Self-pay | Admitting: Surgery

## 2021-10-21 VITALS — BP 151/80 | HR 60 | Temp 98.3°F | Ht 65.0 in | Wt 227.0 lb

## 2021-10-21 DIAGNOSIS — D373 Neoplasm of uncertain behavior of appendix: Secondary | ICD-10-CM | POA: Diagnosis not present

## 2021-10-21 NOTE — Patient Instructions (Signed)
We have discussed having a colectomy in the future to remove a portion of your colon and also lymph nodes. ? ?Call us once you feel ready to do this and we will schedule you to come in to discuss this with Dr Dahlia Byes. ? ?Continue your follow ups with Oncology and GYN.  ? ? ? ?

## 2021-10-21 NOTE — Progress Notes (Signed)
Outpatient Surgical Follow Up ? ?10/21/2021 ? ?Sue Calhoun is an 71 y.o. female.  ? ?Chief Complaint  ?Patient presents with  ? Routine Post Op  ? ? 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  She has done well after surgery.  Please note that the pathology was discussed with her in detail.  In addition to that I perform a CT scan of the abdomen and pelvis for staging purposes.  There is no evidence of metastatic disease.  She also had a contained endometrial cancer that should not need any additional therapy. ?She completed a recent colonoscopy showing some waves but no concerning lesions.  She has a lot of social issues.  She takes care of multiple family members and his son is currently hospitalized due to acute cholecystitis requiring urgent cholecystectomy. ?She is feeling well and things that she has everything under control ? ? ?Past Medical History:  ?Diagnosis Date  ? Anxiety   ? Cancer of appendix ([place])   ? Diabetes mellitus without complication ([place])   ? not started on medication yet  ? Endometrial ca [facility])   ? Endometrial mass   ? ? ?Past Surgical History:  ?Procedure Laterality Date  ? APPENDECTOMY    ? COLONOSCOPY WITH PROPOFOL N/A 10/15/2021  ? Procedure: COLONOSCOPY WITH PROPOFOL;  Surgeon: Lin Landsman, MD;  Location: [facility] ENDOSCOPY;  Service: Gastroenterology;  Laterality: N/A;  ? HYSTEROSCOPY WITH D & C N/A 07/25/2021  ? Procedure: DILATATION AND CURETTAGE /HYSTEROSCOPY -REMOVAL OF CERVICAL/ UTERINE MASS;  Surgeon: Will Bonnet, MD;  Location: [facility] ORS;  Service: Gynecology;  Laterality: N/A;  ? NO PAST SURGERIES    ? ROBOTIC ASSISTED TOTAL HYSTERECTOMY WITH BILATERAL SALPINGO OOPHERECTOMY Bilateral 09/04/2021  ? Procedure: XI ROBOTIC ASSISTED TOTAL HYSTERECTOMY WITH BILATERAL SALPINGO OOPHORECTOMY, SENTINEL  LYMPH NODE INJECTION MAPPING SKIN TAG REMOVAL; partial cecectomy;  Surgeon: Gillis Ends, MD;  Location: [facility] ORS;  Service: Gynecology;  Laterality: Bilateral;  ? ? ?Family History  ?Problem Relation Age of Onset  ? Diabetes Mother   ? Cancer Mother 31  ?     thyroid cancer  ? Vision loss Father   ? Diabetes Father   ? Diabetes Sister   ? Skin cancer Sister   ? Diabetes Sister   ? Diabetes Sister   ? Diabetes Sister   ? Diabetes Sister   ? Diabetes Brother   ? Breast cancer Maternal Grandmother 45  ? ? ?Social History:  reports that she has never smoked. She has never used smokeless tobacco. She reports that she does not currently use alcohol. She reports that she does not use drugs. ? ?Allergies:  ?Allergies  ?Allergen Reactions  ? Nitrofurantoin Macrocrystal Other (See Comments)  ?  Agitation, mood changes, confusion  ? ? ?Medications reviewed. ? ? ? ?ROS ?Full ROS performed and is otherwise negative other than what is stated in HPI ? ? ?BP (!) 151/80   Calhoun 59   Temp 98.3 ?F (36.8 ?C)   Ht '5\' 5"'$  (1.651 m)   Wt 227 lb (103 kg)   SpO2 97%   BMI 37.77 kg/m?  ? ?Physical Exam ?Vitals and nursing note reviewed. Exam conducted with a chaperone present.  ?Constitutional:   ?   Appearance: Normal appearance. She is obese. She is not ill-appearing.  ?Cardiovascular:  ?  Rate and Rhythm: Normal rate and regular rhythm.  ?Pulmonary:  ?   Effort: Pulmonary effort is normal. No respiratory distress.  ?   Breath sounds: Normal breath sounds. No stridor. No wheezing or rhonchi.  ?Abdominal:  ?   General: Abdomen is flat. There is no distension.  ?   Palpations: Abdomen is soft. There is no mass.  ?   Tenderness: There is no abdominal tenderness. There is no guarding or rebound.  ?   Hernia: No hernia is present.  ?   Comments: Incisions healed without infection or hernia  ?Musculoskeletal:     ?   General: No swelling or tenderness. Normal range of motion.  ?   Cervical back: Normal range of motion and neck  supple.  ?Skin: ?   General: Skin is warm and dry.  ?   Capillary Refill: Capillary refill takes less than 2 seconds.  ?Neurological:  ?   General: No focal deficit present.  ?   Mental Status: She is alert and oriented to person, place, and time.  ?Psychiatric:     ?   Mood and Affect: Mood normal.     ?   Behavior: Behavior normal.     ?   Thought Content: Thought content normal.     ?   Judgment: Judgment normal.  ? ? ? ?Assessment/Plan: ?Mucinous neoplasm of the appendix.  Given the size greater than 3 cm I definitely recommend completion right colectomy.  I have discussed in detail my thought process with the patient.  Patient has had a lot on her plate currently and she wishes to postpone any surgical intervention at this time.  She says that "she will call us back" when she is ready. ? ?I spent least 30 minutes in this encounter including personally reviewing imaging studies, placing orders, counseling the patient and performing appropriate documentation ? ? ?Caroleen Hamman, MD FACS ?General Surgeon  ?

## 2021-10-25 ENCOUNTER — Ambulatory Visit: Payer: No Typology Code available for payment source | Admitting: Obstetrics and Gynecology

## 2021-12-18 DIAGNOSIS — Z6836 Body mass index (BMI) 36.0-36.9, adult: Secondary | ICD-10-CM | POA: Diagnosis not present

## 2021-12-18 DIAGNOSIS — F419 Anxiety disorder, unspecified: Secondary | ICD-10-CM | POA: Diagnosis not present

## 2021-12-18 DIAGNOSIS — E782 Mixed hyperlipidemia: Secondary | ICD-10-CM | POA: Diagnosis not present

## 2021-12-18 DIAGNOSIS — E6609 Other obesity due to excess calories: Secondary | ICD-10-CM | POA: Diagnosis not present

## 2021-12-18 DIAGNOSIS — F32A Depression, unspecified: Secondary | ICD-10-CM | POA: Diagnosis not present

## 2021-12-18 DIAGNOSIS — F5101 Primary insomnia: Secondary | ICD-10-CM | POA: Diagnosis not present

## 2021-12-18 DIAGNOSIS — E119 Type 2 diabetes mellitus without complications: Secondary | ICD-10-CM | POA: Diagnosis not present

## 2022-03-12 ENCOUNTER — Inpatient Hospital Stay
Payer: No Typology Code available for payment source | Attending: Hematology and Oncology | Admitting: Obstetrics and Gynecology

## 2022-03-12 VITALS — BP 129/72 | HR 66 | Resp 18 | Ht 65.0 in | Wt 224.0 lb

## 2022-03-12 DIAGNOSIS — Z90710 Acquired absence of both cervix and uterus: Secondary | ICD-10-CM | POA: Diagnosis not present

## 2022-03-12 DIAGNOSIS — R32 Unspecified urinary incontinence: Secondary | ICD-10-CM | POA: Diagnosis not present

## 2022-03-12 DIAGNOSIS — Z8542 Personal history of malignant neoplasm of other parts of uterus: Secondary | ICD-10-CM | POA: Insufficient documentation

## 2022-03-12 DIAGNOSIS — Z9079 Acquired absence of other genital organ(s): Secondary | ICD-10-CM | POA: Diagnosis not present

## 2022-03-12 DIAGNOSIS — Z90722 Acquired absence of ovaries, bilateral: Secondary | ICD-10-CM | POA: Insufficient documentation

## 2022-03-12 DIAGNOSIS — Z8589 Personal history of malignant neoplasm of other organs and systems: Secondary | ICD-10-CM | POA: Diagnosis not present

## 2022-03-12 DIAGNOSIS — C541 Malignant neoplasm of endometrium: Secondary | ICD-10-CM

## 2022-03-12 DIAGNOSIS — E1165 Type 2 diabetes mellitus with hyperglycemia: Secondary | ICD-10-CM

## 2022-03-12 DIAGNOSIS — E119 Type 2 diabetes mellitus without complications: Secondary | ICD-10-CM | POA: Insufficient documentation

## 2022-03-12 NOTE — Patient Instructions (Signed)
Please call and make appointment to see Dr. Glennon Mac in 4 months. We will see you back in 8 months.

## 2022-03-12 NOTE — Progress Notes (Signed)
Gynecologic Oncology Interval Visit   Referring Provider: Prentice Docker, MD  Chief Concern: Endometrial cancer, surveillance  Subjective:  Sue Calhoun is a 71 y.o. [phone] female who is seen in consultation from Dr. Glennon Mac for EIN.  She presents today for her surveillance visit. She opted not to proceed with surgery with Dr. Dahlia Byes. She has no complaints other than urinary incontinence.  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  Mammogram 09/19/2021 Screening mammogram in one year. (Code:SM-B-01Y) BI-RADS CATEGORY  1: Negative.   Gynecologic Oncology History She initially presented with history of postmenopausal bleeding in 2020 and went to the ER for evaluation. Ultrasound noted enlarged endometrial lining on ultrasound. See prior notes for complete details.   07/25/2021 she underwent hysteroscopy, dilation and curettage, and Myosure.  DIAGNOSIS:  A. 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   B. 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   HbA1c = 8 05/19/21   09/04/21 - underwent robotic TLH, BSO, SLN mapping, appendectomy, resection of small benign right buttocks lesion.  Intra op frozen did not show invasive cancer, so SLNs not removed.  Final pathology showed stage IA endometrioid cancer with 8/19 mm invasion.  No LVSI, and adnexa, cervix and washings negative.   Appendiceal lesion noted at tip and Dr Dahlia Byes resected appendix. Final path showed LAMIN with negative margins.   Tumor Size: Greatest dimension: 0.8 x 0.8 cm  Myometrial Invasion: Present 8/19 mm (42%)  Uterine Serosa Involvement, Cervical Stromal Involvement, LVSI: Not identified   APPENDIX; APPENDECTOMY:  - LOW-GRADE APPENDICEAL MUCINOUS NEOPLASM, WITH ACELLULAR MUCIN PRESENT  AT SEROSAL SURFACE.    Postop CT 09/15/2021 to assess appendiceal malignancy. IMPRESSION: Status post appendectomy, hysterectomy, and bilateral oophorectomy. Postsurgical changes within the pelvis. No evidence of recurrent or residual disease within the abdomen and pelvis. Surgery including possible colectomy was recommended. She is feeling overwhelmed and elected to have colonoscopy 10/15/21 (diverticular disease and tubular adenomas). She opted against surgery.    Problem List: Patient Active Problem List   Diagnosis Date Noted   Endometrial mass 07/22/2018   Postmenopausal bleeding 07/01/2018   Skin lesion of right lower extremity 07/01/2018   Anxiety 11/17/2017   Depression 11/17/2017   Class 2 obesity due to excess calories without serious comorbidity with body mass index (BMI) of 37.0 to 37.9 in adult 12/09/2012   Foot pain 06/10/2012    Past Medical History: Past Medical History:  Diagnosis Date   Anxiety    Diabetes mellitus without complication ([place])    not started on medication yet   Endometrial mass     Past Surgical History: Past Surgical History:  Procedure Laterality Date   HYSTEROSCOPY WITH D & C N/A 07/25/2021   Procedure: DILATATION AND CURETTAGE /HYSTEROSCOPY -REMOVAL OF CERVICAL/ UTERINE MASS;  Surgeon: Will Bonnet, MD;  Location: [facility] ORS;  Service: Gynecology;  Laterality: N/A;   NO PAST SURGERIES      Past Gynecologic History:  Menarche: age 49.  Post menopausal Hx of Chlamydia in April 1988.    OB History:  OB History  Gravida Para Term Preterm AB Living  '6 1 1   5 1  '$ SAB IAB Ectopic Multiple Live Births  5       1    # Outcome Date GA Lbr Len/2nd Weight Sex Delivery Anes PTL Lv  6 SAB  5 Term           4 SAB           3 SAB           2 SAB           1 SAB             Obstetric Comments  1 vaginal delivery    Family History: Family History  Problem Relation Age of Onset   Diabetes Mother    Cancer Mother 67       thyroid cancer   Diabetes  Father    Diabetes Sister    Skin cancer Sister    Diabetes Sister    Diabetes Sister    Diabetes Sister    Diabetes Sister    Diabetes Brother    Breast cancer Maternal Grandmother 105    Social History: Social History   Socioeconomic History   Marital status: Single    Spouse name: Not on file   Number of children: 1   Years of education: Not on file   Highest education level: Not on file  Occupational History   Not on file  Tobacco Use   Smoking status: Never   Smokeless tobacco: Never  Vaping Use   Vaping Use: Never used  Substance and Sexual Activity   Alcohol use: Not Currently   Drug use: Never   Sexual activity: Not Currently    Birth control/protection: Post-menopausal  Other Topics Concern   Not on file  Social History Narrative   Lives with son. 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 Helps to take care of her 5 sisters, all of whom are disabled.    Social Determinants of Health   Financial Resource Strain: Not on file  Food Insecurity: Not on file  Transportation Needs: Not on file  Physical Activity: Not on file  Stress: Not on file  Social Connections: Not on file  Intimate Partner Violence: Not on file    Allergies: No Known Allergies    Review of Systems General: no complaints  HEENT: no complaints  Lungs: no complaints  Cardiac: no complaints  GI: no complaints  GU: no complaints  Musculoskeletal: no complaints  Extremities: no complaints  Skin: no complaints  Neuro: no complaints  Endocrine: no complaints  Psych: no complaints       Objective:  Physical Examination:  BP (!) 143/83 (BP Location: Left Arm, Patient Position: Sitting)   Pulse 69   Temp 98 F (36.7 C) (Tympanic)   Resp 18   Wt 229 lb (103.9 kg)   SpO2 98%   BMI 37.24 kg/m   ECOG Performance Status: 0 - Asymptomatic  GENERAL: Patient is a well appearing female in no acute distress HEENT:  Atraumatic and normocephalic. PERRL, neck supple. NODES:   No cervical, supraclavicular, axillary, or inguinal lymphadenopathy palpated.  LUNGS:  Clear to auscultation bilaterally.  No wheezes. HEART:  Regular rate and rhythm.  ABDOMEN:  Soft, nontender. Nondistended. No masses/ascites/hernia/or hepatomegaly.  EXTREMITIES:  No peripheral edema.   SKIN: Abdominal incisions well healed NEURO:  Nonfocal. Well oriented.  Appropriate affect.  Pelvic: EGBUS: no lesions Cervix: surgically absent Vagina: no lesions, no discharge or bleeding. Unable to perform Kegel exercises.  Uterus: surgically absent BME: no palpable masses Rectovaginal: deferred   Perianal wound site incision completely healed.    Lab Review N/A   Radiologic Imaging: As per prior notes; No recent imaging.  Assessment:  Sue Calhoun is a 71 y.o. female diagnosed with EIN atb least in a polyp on D&C for PMB.  On 09/04/21 underwent robotic TLH, BSO, SLN mapping, appendectomy, resection of small benign right buttocks lesion.  Intra op frozen did not show invasive cancer, so SLNs not removed.  Final pathology showed stage IA grade 1 endometrioid cancer with 8/19 mm (MELF pattern) invasion.  No LVSI, and adnexa, cervix and washings negative.   Appendiceal lesion noted at tip and Dr Dahlia Byes resected appendix. Final path showed LAMIN with negative margins. Patient opted for observation.   Symptomatic skin tag on right buttocks removed and incision healed completely.    DM, history of poorly controlled.   History of possible septal infarct based on ECG, asymptomatic, follow up with PCP  Cholelithiasis and diverticulosis, asymptomatic   Urinary incontinence, unspecified, increased symptoms. Pelvic floor muscle weakness  Medical co-morbidities complicating care: Diabetes. Jehovah's witness. Body mass index is 37.28 kg/m.   Plan:   Endometrial cancer ([place])  Urinary incontinence, unspecified type   Urinary incontinence. Referred to Dr. Wannetta Sender, Urogynecology for  assessment of incontinence and education regarding Kegel exercises.  Her pelvic floor musculature is very weak.  We did discuss pelvic floor physical therapy but felt that it would be best for her to see Dr. Wannetta Sender first.  Follow up with her PCP regarding orther medical issues.   Continue close follow up with exams, including pelvic exams every 4-6 months for 5 years and then annually thereafter.  We will begin to alternate visits with Dr. Glennon Mac.  Recommended she make a appointment with Dr. Glennon Mac in 4 months and return to our clinic in 8 months.  Imaging and laboratory assessment is based on clinical indication. Patient education previously provided regarding obesity, lifestyle, exercise, nutrition, sexual health. We discussed her weight and need for weight loss, stressed good nutrition, and exercise.      .   I personally saw the patient, performed this encounter and provided counseling.  Vyolet Sakuma Gaetana Michaelis, MD    CC:  Will Bonnet, MD [address] [place],  [place] [address] [phone]

## 2022-03-26 DIAGNOSIS — E119 Type 2 diabetes mellitus without complications: Secondary | ICD-10-CM | POA: Diagnosis not present

## 2022-03-26 DIAGNOSIS — E782 Mixed hyperlipidemia: Secondary | ICD-10-CM | POA: Diagnosis not present

## 2022-04-02 DIAGNOSIS — E782 Mixed hyperlipidemia: Secondary | ICD-10-CM | POA: Diagnosis not present

## 2022-04-02 DIAGNOSIS — Z0000 Encounter for general adult medical examination without abnormal findings: Secondary | ICD-10-CM | POA: Diagnosis not present

## 2022-04-02 DIAGNOSIS — F5101 Primary insomnia: Secondary | ICD-10-CM | POA: Diagnosis not present

## 2022-04-02 DIAGNOSIS — F419 Anxiety disorder, unspecified: Secondary | ICD-10-CM | POA: Diagnosis not present

## 2022-04-02 DIAGNOSIS — F32A Depression, unspecified: Secondary | ICD-10-CM | POA: Diagnosis not present

## 2022-04-02 DIAGNOSIS — E119 Type 2 diabetes mellitus without complications: Secondary | ICD-10-CM | POA: Diagnosis not present

## 2022-04-02 DIAGNOSIS — E6609 Other obesity due to excess calories: Secondary | ICD-10-CM | POA: Diagnosis not present

## 2022-04-02 DIAGNOSIS — Z23 Encounter for immunization: Secondary | ICD-10-CM | POA: Diagnosis not present

## 2022-04-02 DIAGNOSIS — N9489 Other specified conditions associated with female genital organs and menstrual cycle: Secondary | ICD-10-CM | POA: Diagnosis not present

## 2022-04-02 DIAGNOSIS — Z6836 Body mass index (BMI) 36.0-36.9, adult: Secondary | ICD-10-CM | POA: Diagnosis not present

## 2022-04-21 ENCOUNTER — Encounter: Payer: Self-pay | Admitting: *Deleted

## 2022-04-30 DIAGNOSIS — R32 Unspecified urinary incontinence: Secondary | ICD-10-CM | POA: Diagnosis not present

## 2022-04-30 DIAGNOSIS — E1169 Type 2 diabetes mellitus with other specified complication: Secondary | ICD-10-CM | POA: Diagnosis not present

## 2022-04-30 DIAGNOSIS — E785 Hyperlipidemia, unspecified: Secondary | ICD-10-CM | POA: Diagnosis not present

## 2022-04-30 DIAGNOSIS — F1020 Alcohol dependence, uncomplicated: Secondary | ICD-10-CM | POA: Diagnosis not present

## 2022-04-30 DIAGNOSIS — Z6837 Body mass index (BMI) 37.0-37.9, adult: Secondary | ICD-10-CM | POA: Diagnosis not present

## 2022-04-30 DIAGNOSIS — Z79899 Other long term (current) drug therapy: Secondary | ICD-10-CM | POA: Diagnosis not present

## 2022-04-30 DIAGNOSIS — R2681 Unsteadiness on feet: Secondary | ICD-10-CM | POA: Diagnosis not present

## 2022-04-30 DIAGNOSIS — Z008 Encounter for other general examination: Secondary | ICD-10-CM | POA: Diagnosis not present

## 2022-04-30 DIAGNOSIS — I700 Atherosclerosis of aorta: Secondary | ICD-10-CM | POA: Diagnosis not present

## 2022-05-07 DIAGNOSIS — R69 Illness, unspecified: Secondary | ICD-10-CM | POA: Diagnosis not present

## 2022-05-23 DIAGNOSIS — R69 Illness, unspecified: Secondary | ICD-10-CM | POA: Diagnosis not present

## 2022-06-26 DIAGNOSIS — F32A Depression, unspecified: Secondary | ICD-10-CM | POA: Diagnosis not present

## 2022-06-26 DIAGNOSIS — F419 Anxiety disorder, unspecified: Secondary | ICD-10-CM | POA: Diagnosis not present

## 2022-06-26 DIAGNOSIS — E6609 Other obesity due to excess calories: Secondary | ICD-10-CM | POA: Diagnosis not present

## 2022-06-26 DIAGNOSIS — E119 Type 2 diabetes mellitus without complications: Secondary | ICD-10-CM | POA: Diagnosis not present

## 2022-06-26 DIAGNOSIS — Z6836 Body mass index (BMI) 36.0-36.9, adult: Secondary | ICD-10-CM | POA: Diagnosis not present

## 2022-06-26 DIAGNOSIS — F5101 Primary insomnia: Secondary | ICD-10-CM | POA: Diagnosis not present

## 2022-07-03 DIAGNOSIS — F419 Anxiety disorder, unspecified: Secondary | ICD-10-CM | POA: Diagnosis not present

## 2022-07-03 DIAGNOSIS — N9489 Other specified conditions associated with female genital organs and menstrual cycle: Secondary | ICD-10-CM | POA: Diagnosis not present

## 2022-07-03 DIAGNOSIS — Z0000 Encounter for general adult medical examination without abnormal findings: Secondary | ICD-10-CM | POA: Diagnosis not present

## 2022-07-03 DIAGNOSIS — C181 Malignant neoplasm of appendix: Secondary | ICD-10-CM | POA: Diagnosis not present

## 2022-07-03 DIAGNOSIS — Z6836 Body mass index (BMI) 36.0-36.9, adult: Secondary | ICD-10-CM | POA: Diagnosis not present

## 2022-07-03 DIAGNOSIS — F32A Depression, unspecified: Secondary | ICD-10-CM | POA: Diagnosis not present

## 2022-07-03 DIAGNOSIS — E6609 Other obesity due to excess calories: Secondary | ICD-10-CM | POA: Diagnosis not present

## 2022-07-03 DIAGNOSIS — E119 Type 2 diabetes mellitus without complications: Secondary | ICD-10-CM | POA: Diagnosis not present

## 2022-07-03 DIAGNOSIS — E782 Mixed hyperlipidemia: Secondary | ICD-10-CM | POA: Diagnosis not present

## 2022-07-03 DIAGNOSIS — F5101 Primary insomnia: Secondary | ICD-10-CM | POA: Diagnosis not present

## 2022-11-12 ENCOUNTER — Ambulatory Visit: Payer: No Typology Code available for payment source

## 2022-11-18 DIAGNOSIS — F5101 Primary insomnia: Secondary | ICD-10-CM | POA: Diagnosis not present

## 2022-11-18 DIAGNOSIS — Z6836 Body mass index (BMI) 36.0-36.9, adult: Secondary | ICD-10-CM | POA: Diagnosis not present

## 2022-11-18 DIAGNOSIS — E119 Type 2 diabetes mellitus without complications: Secondary | ICD-10-CM | POA: Diagnosis not present

## 2022-11-18 DIAGNOSIS — E6609 Other obesity due to excess calories: Secondary | ICD-10-CM | POA: Diagnosis not present

## 2022-11-18 DIAGNOSIS — F32A Depression, unspecified: Secondary | ICD-10-CM | POA: Diagnosis not present

## 2022-12-03 ENCOUNTER — Inpatient Hospital Stay
Payer: No Typology Code available for payment source | Attending: Obstetrics and Gynecology | Admitting: Obstetrics and Gynecology

## 2022-12-03 VITALS — BP 152/74 | HR 56 | Temp 97.6°F | Wt 233.3 lb

## 2022-12-03 DIAGNOSIS — Z8542 Personal history of malignant neoplasm of other parts of uterus: Secondary | ICD-10-CM | POA: Diagnosis not present

## 2022-12-03 DIAGNOSIS — C541 Malignant neoplasm of endometrium: Secondary | ICD-10-CM

## 2022-12-03 DIAGNOSIS — Z90722 Acquired absence of ovaries, bilateral: Secondary | ICD-10-CM | POA: Diagnosis not present

## 2022-12-03 DIAGNOSIS — Z9049 Acquired absence of other specified parts of digestive tract: Secondary | ICD-10-CM | POA: Diagnosis not present

## 2022-12-03 DIAGNOSIS — E119 Type 2 diabetes mellitus without complications: Secondary | ICD-10-CM | POA: Diagnosis not present

## 2022-12-03 DIAGNOSIS — R32 Unspecified urinary incontinence: Secondary | ICD-10-CM | POA: Diagnosis not present

## 2022-12-03 DIAGNOSIS — Z8589 Personal history of malignant neoplasm of other organs and systems: Secondary | ICD-10-CM | POA: Diagnosis not present

## 2022-12-03 DIAGNOSIS — R222 Localized swelling, mass and lump, trunk: Secondary | ICD-10-CM | POA: Diagnosis not present

## 2022-12-03 DIAGNOSIS — Z90710 Acquired absence of both cervix and uterus: Secondary | ICD-10-CM | POA: Insufficient documentation

## 2022-12-03 DIAGNOSIS — M6281 Muscle weakness (generalized): Secondary | ICD-10-CM | POA: Diagnosis not present

## 2022-12-03 NOTE — Progress Notes (Signed)
Gynecologic Oncology Interval Visit   Referring Provider: Thomasene Mohair, MD  Chief Concern: Endometrial cancer, surveillance  Subjective:  Sue Calhoun is a 72 y.o. [phone] female who is seen in consultation from Dr. Jean Rosenthal for EIN.  She presents today for her surveillance visit and has no complaints. She is really busy providing care for her sisters. She missed her last appointment.    Mammogram 09/19/2021 Screening mammogram in one year. (Code:SM-B-01Y) BI-RADS CATEGORY  1: Negative.   Gynecologic Oncology History She initially presented with history of postmenopausal bleeding in 2020 and went to the ER for evaluation. Ultrasound noted enlarged endometrial lining on ultrasound. See prior notes for complete details.   07/25/2021 she underwent hysteroscopy, dilation and curettage, and Myosure.  DIAGNOSIS:  A. 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   B. 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   HbA1c = 8 05/19/21   09/04/21 - underwent robotic TLH, BSO, SLN mapping, appendectomy, resection of small benign right buttocks lesion.  Intra op frozen did not show invasive cancer, so SLNs not removed.  Final pathology showed stage IA endometrioid cancer with 8/19 mm invasion.  No LVSI, and adnexa, cervix and washings negative.   Appendiceal lesion noted at tip and Dr Everlene Farrier resected appendix. Final path showed LAMIN with negative margins.   Tumor Size: Greatest dimension: 0.8 x 0.8 cm  Myometrial Invasion: Present 8/19 mm (42%)  Uterine Serosa Involvement, Cervical Stromal Involvement, LVSI: Not identified   APPENDIX; APPENDECTOMY:  - LOW-GRADE APPENDICEAL MUCINOUS NEOPLASM, WITH ACELLULAR MUCIN PRESENT  AT SEROSAL SURFACE.   Postop CT 09/15/2021 to assess appendiceal malignancy.  IMPRESSION: Status post appendectomy, hysterectomy, and bilateral oophorectomy. Postsurgical changes within the pelvis. No evidence of recurrent or residual disease within the abdomen and pelvis. Surgery including possible colectomy was recommended. She is feeling overwhelmed and elected to have colonoscopy 10/15/21 (diverticular disease and tubular adenomas). She opted against surgery.    Problem List: Patient Active Problem List   Diagnosis Date Noted   Endometrial mass 07/22/2018   Postmenopausal bleeding 07/01/2018   Skin lesion of right lower extremity 07/01/2018   Anxiety 11/17/2017   Depression 11/17/2017   Class 2 obesity due to excess calories without serious comorbidity with body mass index (BMI) of 37.0 to 37.9 in adult 12/09/2012   Foot pain 06/10/2012    Past Medical History: Past Medical History:  Diagnosis Date   Anxiety    Diabetes mellitus without complication ([facility])    not started on medication yet   Endometrial mass     Past Surgical History: Past Surgical History:  Procedure Laterality Date   HYSTEROSCOPY WITH D & C N/A 07/25/2021   Procedure: DILATATION AND CURETTAGE /HYSTEROSCOPY -REMOVAL OF CERVICAL/ UTERINE MASS;  Surgeon: Conard Novak, MD;  Location: [facility] OR[facility];  Service: Gynecology;  Laterality: N/A;   NO PAST SURGERIES      Past Gynecologic History:  Menarche: age 70.  Post menopausal Hx of Chlamydia in April 1988.    OB History:  OB History  Gravida Para Term Preterm AB Living  6 1 1   5 1   SAB IAB Ectopic Multiple Live Births  5       1    # Outcome Date GA Lbr Len/2nd Weight Sex Delivery Anes PTL Lv  6 SAB           5 Term  4 SAB           3 SAB           2 SAB           1 SAB             Obstetric Comments  1 vaginal delivery    Family History: Family History  Problem Relation Age of Onset   Diabetes Mother    Cancer Mother 34       thyroid cancer   Diabetes Father    Diabetes Sister    Skin cancer Sister     Diabetes Sister    Diabetes Sister    Diabetes Sister    Diabetes Sister    Diabetes Brother    Breast cancer Maternal Grandmother 74    Social History: Social History   Socioeconomic History   Marital status: Single    Spouse name: Not on file   Number of children: 1   Years of education: Not on file   Highest education level: Not on file  Occupational History   Not on file  Tobacco Use   Smoking status: Never   Smokeless tobacco: Never  Vaping Use   Vaping Use: Never used  Substance and Sexual Activity   Alcohol use: Not Currently   Drug use: Never   Sexual activity: Not Currently    Birth control/protection: Post-menopausal  Other Topics Concern   Not on file  Social History Narrative   Lives with son. She works as a Engineer, structural to an elderly gentleman. Helps to take care of her 5 sisters, all of whom are disabled.    Social Determinants of Health   Financial Resource Strain: Not on file  Food Insecurity: Not on file  Transportation Needs: Not on file  Physical Activity: Not on file  Stress: Not on file  Social Connections: Not on file  Intimate Partner Violence: Not on file    Allergies: No Known Allergies    Review of Systems General: no complaints  HEENT: no complaints  Lungs: no complaints  Cardiac: no complaints  GI: no complaints  GU: no complaints  Musculoskeletal: no complaints  Extremities: no complaints  Skin: no complaints  Neuro: no complaints  Endocrine: no complaints  Psych: no complaints       Objective:  Physical Examination:  BP (!) 143/83 (BP Location: Left Arm, Patient Position: Sitting)   Pulse 69   Temp 98 F (36.7 C) (Tympanic)   Resp 18   Wt 229 lb (103.9 kg)   SpO2 98%   BMI 37.24 kg/m   ECOG Performance Status: 0 - Asymptomatic  GENERAL: Patient is a well appearing female in no acute distress HEENT:  PERRL, neck supple with midline trachea.   NODES:  No cervical, supraclavicular, axillary, or inguinal  lymphadenopathy palpated.  LUNGS:  Clear to auscultation bilaterally.   HEART:  Regular rate and rhythm.  ABDOMEN:  Soft, nontender.  Protuberant but not distended. 2 cm mass ~ 2 cm away from the LLQ port site. Nontender. MSK:  No focal spinal tenderness to palpation.  EXTREMITIES:  No peripheral edema.   NEURO:  Nonfocal. Well oriented.  Appropriate affect.  Pelvic: EGBUS: no lesions Cervix: surgically absent Vagina: no lesions, no discharge or bleeding Uterus: surgically absent BME: no palpable masses   Lab Review N/A   Radiologic Imaging: As per prior notes; No recent imaging.     Assessment:  Sue Calhoun is a 72 y.o. female diagnosed  with EIN atb least in a polyp on D&C for PMB.  On 09/04/21 underwent robotic TLH, BSO, SLN mapping, appendectomy, resection of small benign right buttocks lesion.  Intra op frozen did not show invasive cancer, so SLNs not removed.  Final pathology showed stage IA grade 1 endometrioid cancer with 8/19 mm (MELF pattern) invasion.  No LVSI, and adnexa, cervix and washings negative.   Appendiceal lesion noted at tip and Dr Everlene Farrier resected appendix. Final path showed LAMIN with negative margins. Patient opted for observation.   LLQ abdominal wall mass, nontender  Symptomatic skin tag on right buttocks removed and incision healed completely.    DM, history of poorly controlled.   History of possible septal infarct based on ECG, asymptomatic, follow up with PCP  Cholelithiasis and diverticulosis, asymptomatic   Urinary incontinence, unspecified, increased symptoms. Pelvic floor muscle weakness  Medical co-morbidities complicating care: Diabetes. Jehovah's witness. There is no height or weight on file to calculate BMI.   Plan:   No diagnosis found.   Urinary incontinence. Previously referred to Dr. Florian Buff, Urogynecology for assessment of incontinence and education regarding Kegel exercises.    Follow up with her PCP regarding orther  medical issues and screening mammograms.   Order CT scan to assess for possible abdominal wall mass. If positive will need biopsy given two malignancies. If negative plan to continue close follow up with exams, including pelvic exams every 4-6 months for 5 years and then annually thereafter.  Can consider alternate visits with Dr. Jean Rosenthal pending CT results   Imaging and laboratory assessment is based on clinical indication. Patient education previously provided regarding obesity, lifestyle, exercise, nutrition, sexual health. We discussed her weight and need for weight loss, stressed good nutrition, and exercise.      .   I personally saw the patient, performed this encounter and provided counseling.  Macayla Ekdahl Leta Jungling, MD    CC:  Conard Novak, MD [address] [place],  [place] [address] [phone]

## 2022-12-09 ENCOUNTER — Ambulatory Visit: Admission: RE | Admit: 2022-12-09 | Payer: No Typology Code available for payment source | Source: Ambulatory Visit

## 2022-12-10 ENCOUNTER — Telehealth: Payer: Self-pay

## 2022-12-10 NOTE — Telephone Encounter (Signed)
Call placed to Sue Calhoun regarding missed CT scan. She had the wrong date. Scheduling message sent to have CT rescheduled.

## 2022-12-12 IMAGING — MG MM DIGITAL SCREENING BILAT W/ TOMO AND CAD
8 of 15 series · 8 of 40 positions shown · non-contrast
Comparison: Previous exam(s).

CLINICAL DATA: Screening.

EXAM:
DIGITAL SCREENING BILATERAL MAMMOGRAM WITH TOMOSYNTHESIS AND CAD
TECHNIQUE: Bilateral screening digital craniocaudal and mediolateral oblique
mammograms were obtained. Bilateral screening digital breast
tomosynthesis was performed. The images were evaluated with
computer-aided detection.

[R MLO synth-2D (1 of 2)]
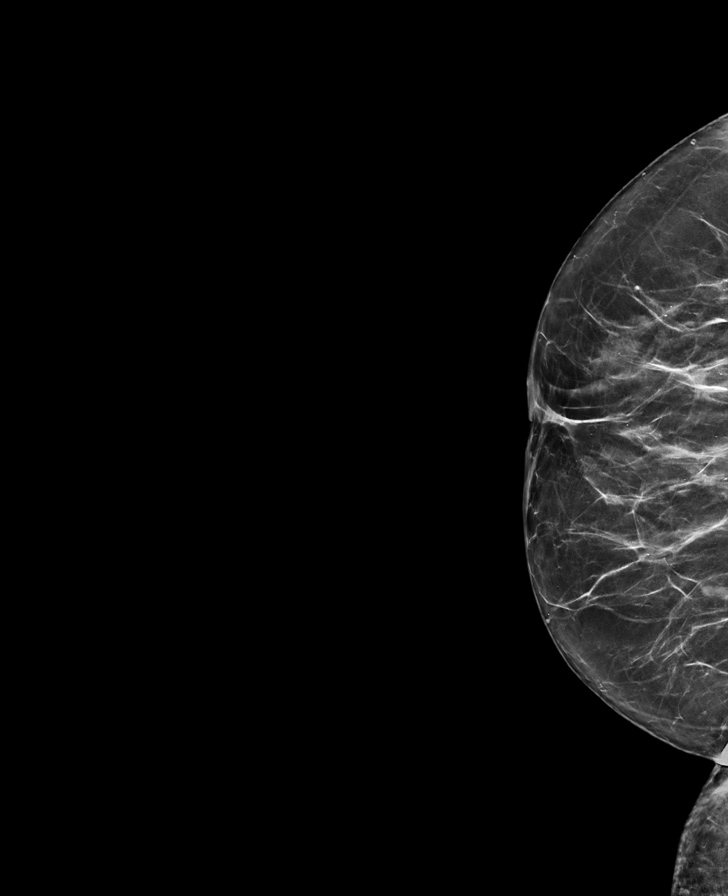

[L MLO synth-2D]
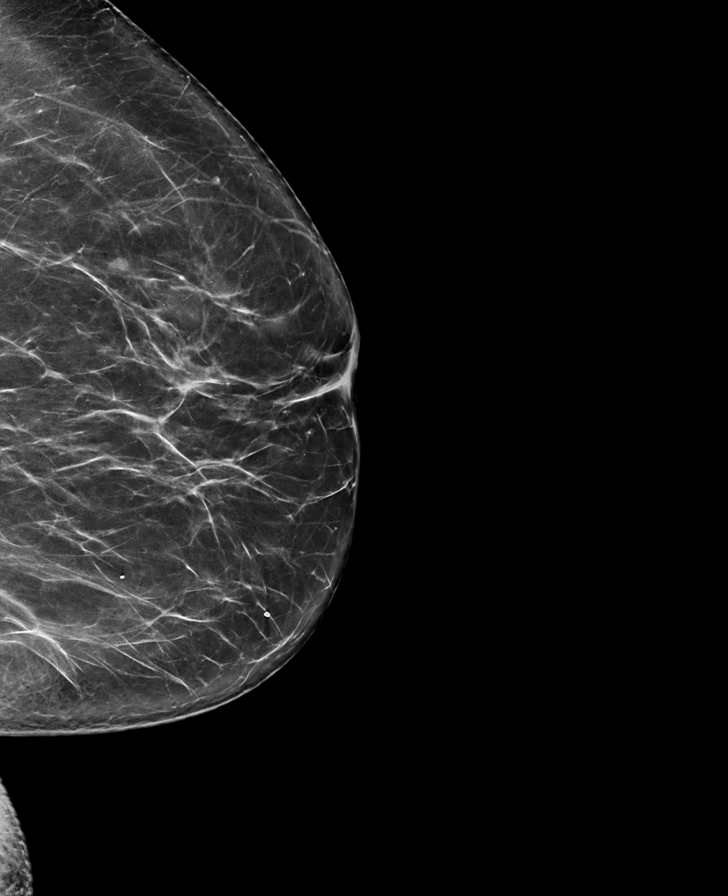

[R CC synth-2D (1 of 2)]
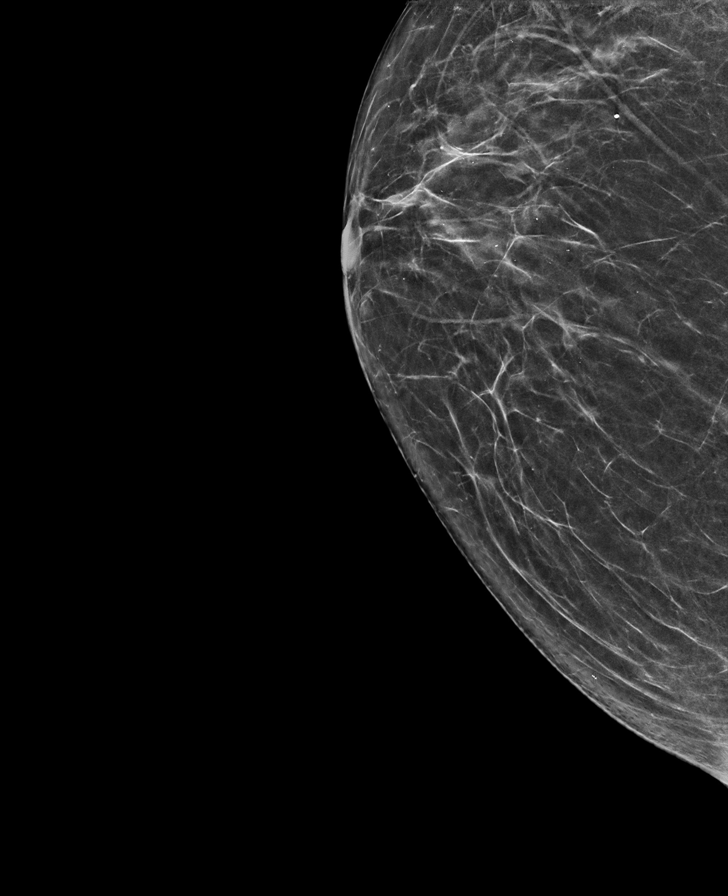

[L CC synth-2D (1 of 2)]
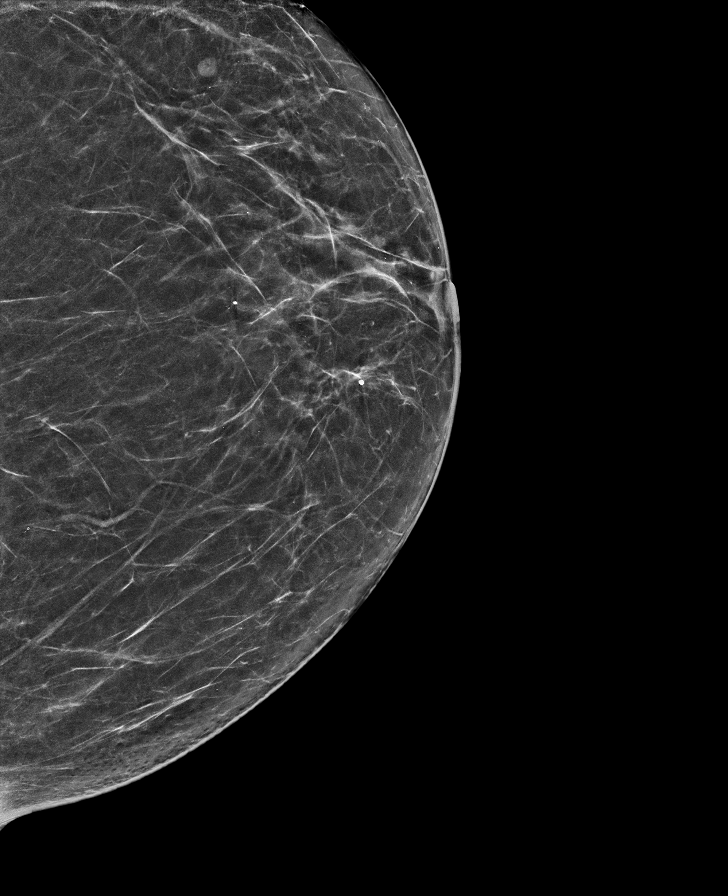

[R CC synth-2D (2 of 2)]
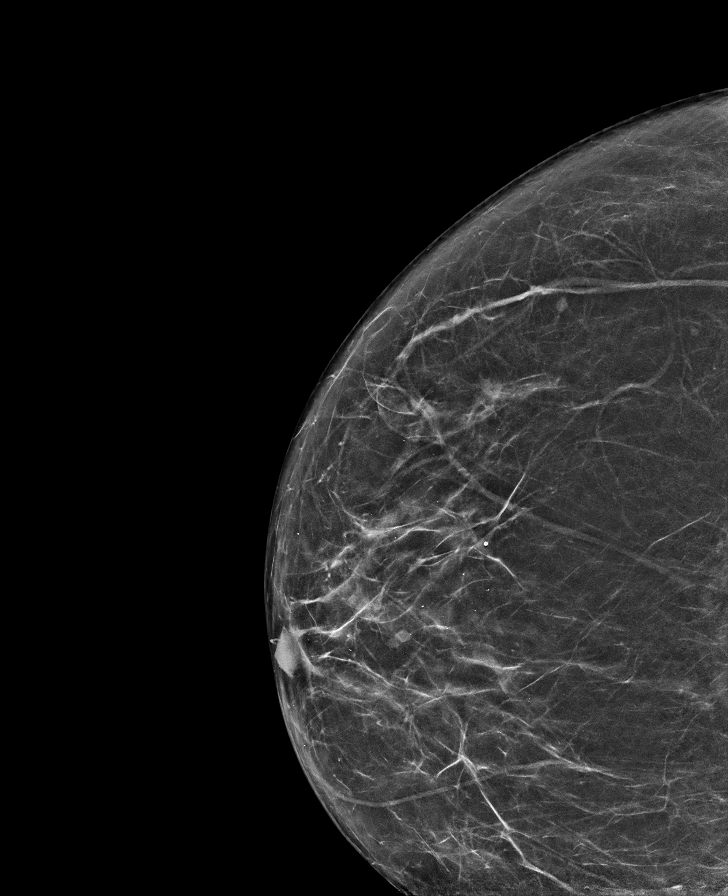

[L CC synth-2D (2 of 2)]
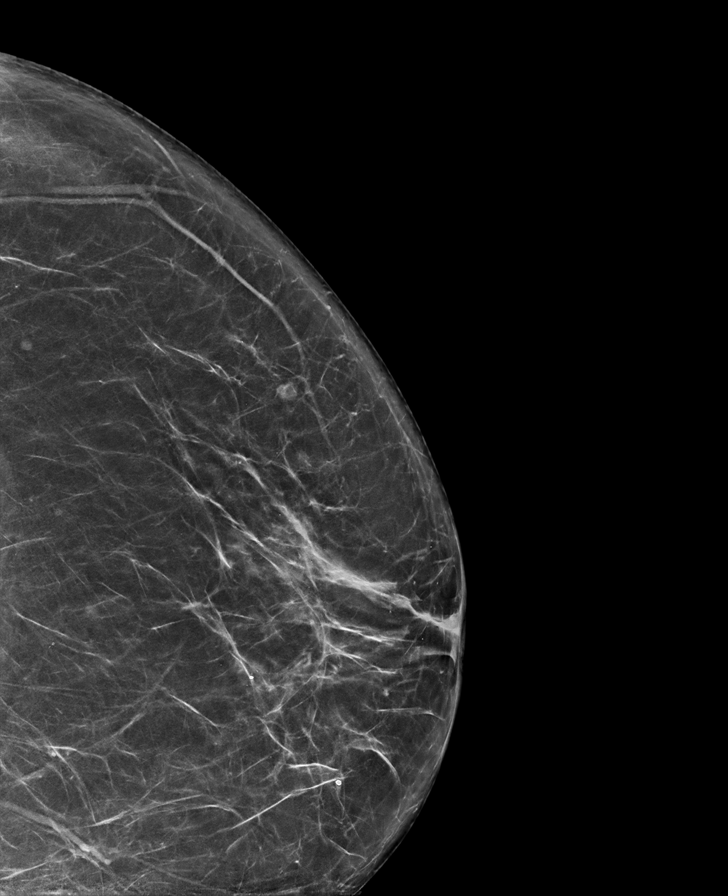

[R MLO synth-2D (2 of 2)]
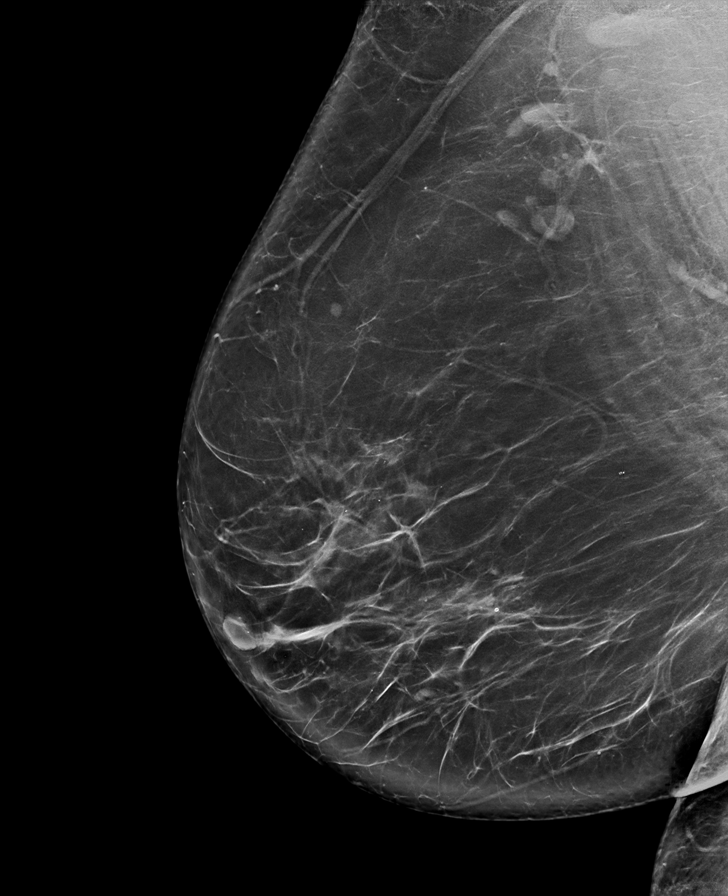

[R CC tomo · tomo slice 51/74.0]
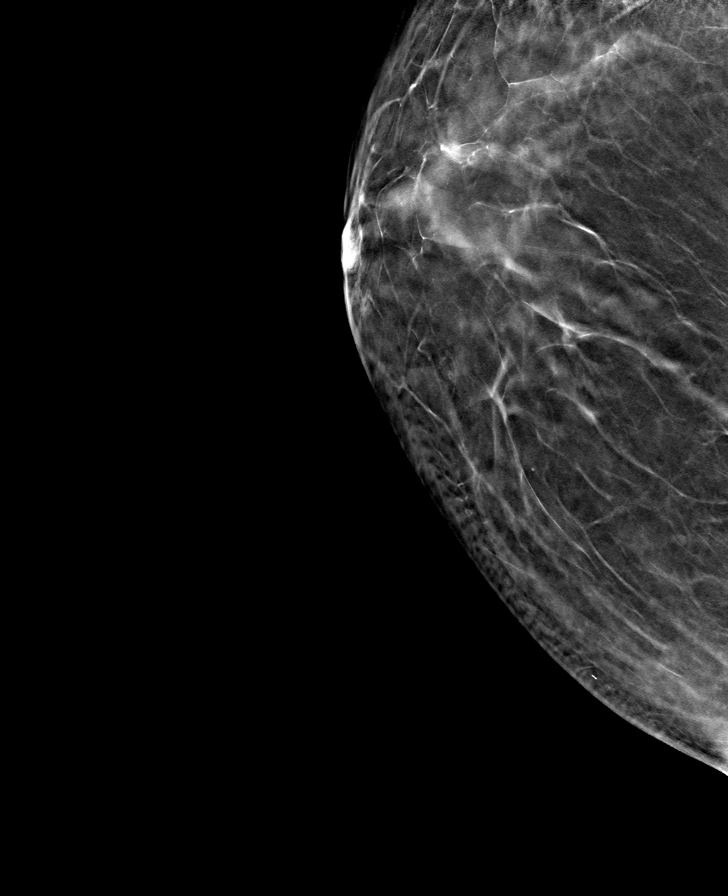

[8 of 40 positions shown; findings below may reference images not displayed]

ACR Breast Density Category b: There are scattered areas of
fibroglandular density.
FINDINGS: There are no findings suspicious for malignancy.
IMPRESSION: No mammographic evidence of malignancy. A result letter of this
screening mammogram will be mailed directly to the patient.

RECOMMENDATION:
Screening mammogram in one year. (Code:51-O-LD2)

BI-RADS CATEGORY  1: Negative.

## 2022-12-17 ENCOUNTER — Ambulatory Visit: Admission: RE | Admit: 2022-12-17 | Payer: No Typology Code available for payment source | Source: Ambulatory Visit

## 2023-01-29 ENCOUNTER — Telehealth: Payer: Self-pay

## 2023-01-29 NOTE — Telephone Encounter (Signed)
Call placed to Sue Calhoun to follow up on having her CT scan done. Her last two scheduled scans have been cancelled. She reports her sister is not doing well and she cannot leave her. She has been provided the phone number to call and have her scan rescheduled if her circumstances changes.

## 2023-03-18 ENCOUNTER — Inpatient Hospital Stay: Payer: No Typology Code available for payment source

## 2023-05-18 DIAGNOSIS — E119 Type 2 diabetes mellitus without complications: Secondary | ICD-10-CM | POA: Diagnosis not present

## 2023-05-18 DIAGNOSIS — Z6836 Body mass index (BMI) 36.0-36.9, adult: Secondary | ICD-10-CM | POA: Diagnosis not present

## 2023-05-18 DIAGNOSIS — E6609 Other obesity due to excess calories: Secondary | ICD-10-CM | POA: Diagnosis not present

## 2023-05-18 DIAGNOSIS — F5101 Primary insomnia: Secondary | ICD-10-CM | POA: Diagnosis not present

## 2023-05-18 DIAGNOSIS — F32A Depression, unspecified: Secondary | ICD-10-CM | POA: Diagnosis not present

## 2023-05-18 DIAGNOSIS — E66812 Obesity, class 2: Secondary | ICD-10-CM | POA: Diagnosis not present

## 2023-05-18 DIAGNOSIS — Z23 Encounter for immunization: Secondary | ICD-10-CM | POA: Diagnosis not present

## 2023-05-18 DIAGNOSIS — E782 Mixed hyperlipidemia: Secondary | ICD-10-CM | POA: Diagnosis not present

## 2023-08-14 DIAGNOSIS — E119 Type 2 diabetes mellitus without complications: Secondary | ICD-10-CM | POA: Diagnosis not present

## 2023-08-14 DIAGNOSIS — E782 Mixed hyperlipidemia: Secondary | ICD-10-CM | POA: Diagnosis not present

## 2023-08-26 DIAGNOSIS — F32A Depression, unspecified: Secondary | ICD-10-CM | POA: Diagnosis not present

## 2023-08-26 DIAGNOSIS — E6609 Other obesity due to excess calories: Secondary | ICD-10-CM | POA: Diagnosis not present

## 2023-08-26 DIAGNOSIS — E66812 Obesity, class 2: Secondary | ICD-10-CM | POA: Diagnosis not present

## 2023-08-26 DIAGNOSIS — C181 Malignant neoplasm of appendix: Secondary | ICD-10-CM | POA: Diagnosis not present

## 2023-08-26 DIAGNOSIS — F5101 Primary insomnia: Secondary | ICD-10-CM | POA: Diagnosis not present

## 2023-08-26 DIAGNOSIS — R7303 Prediabetes: Secondary | ICD-10-CM | POA: Diagnosis not present

## 2023-08-26 DIAGNOSIS — Z6836 Body mass index (BMI) 36.0-36.9, adult: Secondary | ICD-10-CM | POA: Diagnosis not present

## 2023-08-26 DIAGNOSIS — E119 Type 2 diabetes mellitus without complications: Secondary | ICD-10-CM | POA: Diagnosis not present

## 2023-08-26 DIAGNOSIS — E782 Mixed hyperlipidemia: Secondary | ICD-10-CM | POA: Diagnosis not present

## 2023-11-24 DIAGNOSIS — E66812 Obesity, class 2: Secondary | ICD-10-CM | POA: Diagnosis not present

## 2023-11-24 DIAGNOSIS — Z1231 Encounter for screening mammogram for malignant neoplasm of breast: Secondary | ICD-10-CM | POA: Diagnosis not present

## 2023-11-24 DIAGNOSIS — Z780 Asymptomatic menopausal state: Secondary | ICD-10-CM | POA: Diagnosis not present

## 2023-11-24 DIAGNOSIS — Z1212 Encounter for screening for malignant neoplasm of rectum: Secondary | ICD-10-CM | POA: Diagnosis not present

## 2023-11-24 DIAGNOSIS — E119 Type 2 diabetes mellitus without complications: Secondary | ICD-10-CM | POA: Diagnosis not present

## 2023-11-24 DIAGNOSIS — C181 Malignant neoplasm of appendix: Secondary | ICD-10-CM | POA: Diagnosis not present

## 2023-11-24 DIAGNOSIS — F5101 Primary insomnia: Secondary | ICD-10-CM | POA: Diagnosis not present

## 2023-11-24 DIAGNOSIS — E6609 Other obesity due to excess calories: Secondary | ICD-10-CM | POA: Diagnosis not present

## 2023-11-24 DIAGNOSIS — Z0000 Encounter for general adult medical examination without abnormal findings: Secondary | ICD-10-CM | POA: Diagnosis not present

## 2023-11-24 DIAGNOSIS — Z1211 Encounter for screening for malignant neoplasm of colon: Secondary | ICD-10-CM | POA: Diagnosis not present

## 2023-11-24 DIAGNOSIS — F32A Depression, unspecified: Secondary | ICD-10-CM | POA: Diagnosis not present

## 2023-11-24 DIAGNOSIS — E782 Mixed hyperlipidemia: Secondary | ICD-10-CM | POA: Diagnosis not present

## 2023-11-25 ENCOUNTER — Other Ambulatory Visit: Payer: Self-pay | Admitting: Infectious Diseases

## 2023-11-25 DIAGNOSIS — Z1231 Encounter for screening mammogram for malignant neoplasm of breast: Secondary | ICD-10-CM

## 2023-12-08 DIAGNOSIS — Z780 Asymptomatic menopausal state: Secondary | ICD-10-CM | POA: Diagnosis not present

## 2024-03-01 DIAGNOSIS — D373 Neoplasm of uncertain behavior of appendix: Secondary | ICD-10-CM | POA: Diagnosis not present

## 2024-03-01 NOTE — Progress Notes (Signed)
 Corinn JONELLE Brooklyn, MD [facility], DHIP [address]  [place], [place] [address]  Main: [phone] Fax:  [phone] Pager: [phone]   Gastroenterology Consultation  Referring Provider:     Epifanio Alm Dudley* Primary Care Physician:  Epifanio Alm Dover, MD Primary Gastroenterologist:  Dr. Corinn JONELLE Brooklyn Reason for Consultation: Low-grade mucinous neoplasm of the appendix        HPI:   Sue Calhoun is a 73 y.o. female referred by Dr. Epifanio, Alm Dover, MD  for consultation & management of to discuss about colonoscopy.  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  There was no evidence of metastatic disease.  Patient was seen by Dr. Jordis who recommended right hemicolectomy given the size of tumor.  She subsequently underwent colonoscopy which revealed subcentimeter tubular adenomas of the colon only.  Patient did not opt for right hemicolectomy because there was a lot going on in her personal life.  She was also taking care of her son.  Her 2 sisters passed away within last 2 years and she is coping from the loss.  She has insomnia, anxiety, depression, is very overwhelmed and she is not sure if she is ready for surgery.  NSAIDs: None  Antiplts/Anticoagulants/Anti thrombotics: None  GI Procedures:  Colonoscopy 10/15/2021 Two diminutive polyps in the ascending colon, removed with a jumbo cold forceps. Resected and  retrieved. One 5 mm polyp in the transverse colon, removed with  a cold snare. Resected and retrieved. Diverticulosis in the rectosigmoid colon and in the sigmoid colon. The distal rectum and anal verge are normal on retroflexion view. DIAGNOSIS:  A. COLON POLYP X2, ASCENDING; COLD BIOPSY:  - TUBULAR ADENOMA (2).  - NEGATIVE FOR HIGH-GRADE DYSPLASIA AND MALIGNANCY.   B.  COLON POLYP, TRANSVERSE; COLD SNARE:  - TUBULAR ADENOMA.  - NEGATIVE  FOR HIGH-GRADE DYSPLASIA AND MALIGNANCY.   Past Medical History:  Diagnosis Date  . Anxiety 11/17/2017  . Anxiety   . Depression 11/17/2017  . Depression   . Obesity 12/09/2012    Past Surgical History:  Procedure Laterality Date  . No Past Surgeries       Current Outpatient Medications:  .  ALPRAZolam (XANAX) 1 MG tablet, TAKE 1 TABLET BY MOUTH 3 TIMES A DAY AS NEEDED FOR ANXIETY, Disp: 90 tablet, Rfl: 0 .  atorvastatin (LIPITOR) 10 MG tablet, TAKE 1 TABLET BY MOUTH DAILY, Disp: 90 tablet, Rfl: 3 .  blue-green algae (SPIRULINA MISC), Use once daily Sea kelp supplement, Disp: , Rfl:  .  metFORMIN (GLUCOPHAGE-XR) 500 MG XR tablet, Take 2 tablets (1,000 mg total) by mouth daily with dinner, Disp: 180 tablet, Rfl: 3 .  flash glucose scanning reader (FREESTYLE LIBRE 2 READER) Misc, , Disp: , Rfl:  .  flash glucose sensor (FREESTYLE LIBRE 2 SENSOR) Kit, , Disp: , Rfl:  .  sodium, potassium, and magnesium (SUPREP) oral solution, Take 1 Bottle by mouth as directed One kit contains 2 bottles.  Take both bottles at the times instructed by your provider., Disp: 354 mL, Rfl: 0 .  traZODone (DESYREL) 50 MG tablet, Take 1 tablet (50 mg total) by mouth at bedtime (Patient not taking: Reported on 03/01/2024), Disp: 30 tablet, Rfl: 11   Family History  Problem Relation Name Age of Onset  . Diabetes Mother    . Cancer Mother    . Dementia Mother    . Diabetes Father    .  Heart disease Father    . Nephrolithiasis Father    . Diabetes Sister    . Chronic pain Sister    . Heart disease Sister    . High blood pressure (Hypertension) Sister    . Diabetes Brother    . Heart disease Brother    . Anxiety Brother       Social History   Tobacco Use  . Smoking status: Never  . Smokeless tobacco: Never  Vaping Use  . Vaping status: Never Used  Substance Use Topics  . Alcohol use: Yes    Comment: beer and wine  . Drug use: Never    Allergies as of 03/01/2024  . (No Known Allergies)     Review of Systems:    All systems reviewed and negative except where noted in HPI.   Physical Exam:  BP (!) 149/81 (BP Location: Left upper arm, Patient Position: Sitting, BP Cuff Size: Adult)   Pulse 64   Ht 167.6 cm (5' 6)   Wt (!) 101 kg (222 lb 9.6 oz)   BMI 35.93 kg/m  No LMP recorded. Patient is postmenopausal.  General:   Alert,  Well-developed, well-nourished, pleasant and cooperative in NAD Eyes:  Sclera clear, no icterus.   Conjunctiva pink. Lungs:  Respirations even and unlabored.  Clear throughout to auscultation.   No wheezes, crackles, or rhonchi. No acute distress. Heart:  Regular rate and rhythm; no murmurs, clicks, rubs, or gallops. Abdomen:  Normal bowel sounds. Soft, non-tender and non-distended without masses, hepatosplenomegaly or hernias noted.  No guarding or rebound tenderness.   Rectal: Not performed Extremities:  No clubbing or edema.  No cyanosis. Neurologic:  Alert and oriented x3;  grossly normal neurologically. Skin:  Intact without significant lesions or rashes. No jaundice. Psych:  Alert and cooperative. Normal mood and affect.  Imaging Studies: Reviewed  Assessment and Plan:   Logan Vegh is a 73 y.o. 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  The patient agrees with this plan & written consent will be obtained.    Follow up based on the above workup   Corinn JONELLE Brooklyn, MD

## 2024-03-16 ENCOUNTER — Ambulatory Visit: Admitting: Anesthesiology

## 2024-03-16 ENCOUNTER — Ambulatory Visit
Admission: RE | Admit: 2024-03-16 | Discharge: 2024-03-16 | Disposition: A | Discharge status: Home / Self Care | Attending: Gastroenterology | Admitting: Gastroenterology

## 2024-03-16 ENCOUNTER — Encounter
Admission: RE | Disposition: A | Discharge status: Home / Self Care | Payer: Self-pay | Source: Home / Self Care | Attending: Gastroenterology

## 2024-03-16 ENCOUNTER — Encounter: Payer: Self-pay | Admitting: Gastroenterology

## 2024-03-16 DIAGNOSIS — E119 Type 2 diabetes mellitus without complications: Secondary | ICD-10-CM | POA: Insufficient documentation

## 2024-03-16 DIAGNOSIS — F32A Depression, unspecified: Secondary | ICD-10-CM | POA: Insufficient documentation

## 2024-03-16 DIAGNOSIS — D123 Benign neoplasm of transverse colon: Secondary | ICD-10-CM | POA: Diagnosis not present

## 2024-03-16 DIAGNOSIS — K644 Residual hemorrhoidal skin tags: Secondary | ICD-10-CM | POA: Diagnosis not present

## 2024-03-16 DIAGNOSIS — F419 Anxiety disorder, unspecified: Secondary | ICD-10-CM | POA: Insufficient documentation

## 2024-03-16 DIAGNOSIS — K5730 Diverticulosis of large intestine without perforation or abscess without bleeding: Secondary | ICD-10-CM | POA: Insufficient documentation

## 2024-03-16 DIAGNOSIS — K635 Polyp of colon: Secondary | ICD-10-CM | POA: Diagnosis not present

## 2024-03-16 DIAGNOSIS — Z1211 Encounter for screening for malignant neoplasm of colon: Secondary | ICD-10-CM | POA: Diagnosis not present

## 2024-03-16 DIAGNOSIS — Z833 Family history of diabetes mellitus: Secondary | ICD-10-CM | POA: Diagnosis not present

## 2024-03-16 DIAGNOSIS — Z860100 Personal history of colon polyps, unspecified: Secondary | ICD-10-CM | POA: Diagnosis not present

## 2024-03-16 DIAGNOSIS — Z860101 Personal history of adenomatous and serrated colon polyps: Secondary | ICD-10-CM | POA: Diagnosis present

## 2024-03-16 DIAGNOSIS — Z85038 Personal history of other malignant neoplasm of large intestine: Secondary | ICD-10-CM | POA: Insufficient documentation

## 2024-03-16 LAB — GLUCOSE, CAPILLARY: Glucose-Capillary: 123 mg/dL — ABNORMAL HIGH (ref 70–99)

## 2024-03-16 SURGERY — COLONOSCOPY
Anesthesia: General

## 2024-03-16 MED ORDER — SODIUM CHLORIDE 0.9 % IV SOLN: INTRAVENOUS | Status: DC

## 2024-03-16 MED ORDER — LIDOCAINE HCL (PF) 2 % IJ SOLN
INTRAMUSCULAR | Status: AC
Start: 2024-03-16 — End: 2024-03-16
  Filled 2024-03-16: qty 5

## 2024-03-16 MED ORDER — PROPOFOL 500 MG/50ML IV EMUL
INTRAVENOUS | Status: DC | PRN
  Administered 2024-03-16: 75 ug/kg/min via INTRAVENOUS

## 2024-03-16 MED ORDER — GLYCOPYRROLATE 0.2 MG/ML IJ SOLN
INTRAMUSCULAR | Status: AC
  Filled 2024-03-16: qty 1

## 2024-03-16 MED ORDER — PROPOFOL 10 MG/ML IV BOLUS
INTRAVENOUS | Status: DC | PRN
  Administered 2024-03-16: 50 mg via INTRAVENOUS
  Administered 2024-03-16: 30 mg via INTRAVENOUS

## 2024-03-16 MED ORDER — GLYCOPYRROLATE 0.2 MG/ML IJ SOLN
INTRAMUSCULAR | Status: DC | PRN
  Administered 2024-03-16: .2 mg via INTRAVENOUS

## 2024-03-16 MED ORDER — LIDOCAINE HCL (CARDIAC) PF 100 MG/5ML IV SOSY
PREFILLED_SYRINGE | INTRAVENOUS | Status: DC | PRN
  Administered 2024-03-16: 80 mg via INTRAVENOUS

## 2024-03-16 MED ORDER — DEXMEDETOMIDINE HCL IN NACL 80 MCG/20ML IV SOLN
INTRAVENOUS | Status: DC | PRN
Start: 2024-03-16 — End: 2024-03-16
  Administered 2024-03-16: 8 ug via INTRAVENOUS
  Administered 2024-03-16: 12 ug via INTRAVENOUS

## 2024-03-16 NOTE — H&P (Signed)
 Sue JONELLE Brooklyn, MD [facility], [address]  [place], [place] [address]  Main: [phone] Fax:  [phone] Pager: [phone]   Primary Care Physician:  [facility] Primary Gastroenterologist:  Dr. Corinn JONELLE Sue  Pre-Procedure History & Physical: HPI:  Sue Sue is a 73 y.o. female is here for an colonoscopy.   Past Medical History:  Diagnosis Date   Anxiety    Cancer of appendix (HCC)    Diabetes mellitus without complication (HCC)    not started on medication yet   Endometrial ca [facility])    Endometrial mass     Past Surgical History:  Procedure Laterality Date   APPENDECTOMY     COLONOSCOPY WITH PROPOFOL  N/A 10/15/2021   Procedure: COLONOSCOPY WITH PROPOFOL ;  Surgeon: Sue Sue Skiff, MD;  Location: [facility] ENDOSCOPY;  Service: Gastroenterology;  Laterality: N/A;   HYSTEROSCOPY WITH D & C N/A 07/25/2021   Procedure: DILATATION AND CURETTAGE /HYSTEROSCOPY -REMOVAL OF CERVICAL/ UTERINE MASS;  Surgeon: Leonce Garnette BIRCH, MD;  Location: [facility] ORS;  Service: Gynecology;  Laterality: N/A;   NO PAST SURGERIES     ROBOTIC ASSISTED TOTAL HYSTERECTOMY WITH BILATERAL SALPINGO OOPHERECTOMY Bilateral 09/04/2021   Procedure: XI ROBOTIC ASSISTED TOTAL HYSTERECTOMY WITH BILATERAL SALPINGO OOPHORECTOMY, SENTINEL LYMPH NODE INJECTION MAPPING SKIN TAG REMOVAL; partial cecectomy;  Surgeon: Elby Webb Loges, MD;  Location: [facility] ORS;  Service: Gynecology;  Laterality: Bilateral;    Prior to Admission medications   Medication Sig Start Date End Date Taking? Authorizing Provider  traZODone (DESYREL) 50 MG tablet Take 50 mg by mouth at bedtime.   Yes [provider]  ALPRAZolam (XANAX) 1 MG tablet Take 1 mg by mouth 3 (three) times daily as needed for anxiety. 06/24/17   [provider]  atorvastatin (LIPITOR) 10 MG tablet Take 10 mg by mouth daily.    [provider]  metFORMIN (GLUCOPHAGE-XR) 500 MG 24 hr  tablet Take by mouth. 07/03/22 07/03/23  [provider]  Spirulina 500 MG TABS Take 500-1,000 mg by mouth daily.    [provider]    Allergies as of 03/01/2024 - Review Complete 12/03/2022  Allergen Reaction Noted   Nitrofurantoin  macrocrystal Other (See Comments) 08/26/2021    Family History  Problem Relation Age of Onset   Diabetes Mother    Cancer Mother 28       thyroid  cancer   Vision loss Father    Diabetes Father    Diabetes Sister    Skin cancer Sister    Diabetes Sister    Diabetes Sister    Diabetes Sister    Diabetes Sister    Diabetes Brother    Breast cancer Maternal Grandmother 3    Social History   Socioeconomic History   Marital status: Single    Spouse name: Not on file   Number of children: 1   Years of education: Not on file   Highest education level: Not on file  Occupational History   Not on file  Tobacco Use   Smoking status: Never   Smokeless tobacco: Never  Vaping Use   Vaping status: Never Used  Substance and Sexual Activity   Alcohol use: Not Currently    Comment: rarely   Drug use: Never   Sexual activity: Not Currently    Birth control/protection: Post-menopausal  Other Topics Concern   Not on file  Social History Narrative   ** Merged History Encounter **       Lives  with son. She works as a Engineer, structural to an elderly gentleman. Helps to take care of her 5 sisters, all of whom are disabled.    Social Drivers of Corporate investment banker Strain: Not on file  Food Insecurity: Not on file  Transportation Needs: Not on file  Physical Activity: Not on file  Stress: Not on file  Social Connections: Unknown (11/04/2021)   Received from [facility]   Social Network    Social Network: Not on file  Intimate Partner Violence: Unknown (10/01/2021)   Received from [facility]   HITS    Physically Hurt: Not on file    Insult or Talk Down To: Not on file    Threaten Physical Harm: Not on file    Scream or Curse: Not  on file    Review of Systems: See HPI, otherwise negative ROS  Physical Exam: BP (!) 146/64   Pulse (!) 50   Temp (!) 96.5 F (35.8 C) (Temporal)   Resp 18   Ht 5' 5 (1.651 m)   Wt 102.5 kg   SpO2 100%   BMI 37.61 kg/m  General:   Alert,  pleasant and cooperative in NAD Head:  Normocephalic and atraumatic. Neck:  Supple; no masses or thyromegaly. Lungs:  Clear throughout to auscultation.    Heart:  Regular rate and rhythm. Abdomen:  Soft, nontender and nondistended. Normal bowel sounds, without guarding, and without rebound.   Neurologic:  Alert and  oriented x4;  grossly normal neurologically.  Impression/Plan: Sue Sue is here for an colonoscopy to be performed for low-grade mucinous neoplasm of the appendix with no evidence of metastasis, tubular adenomas of the colon   Risks, benefits, limitations, and alternatives regarding  colonoscopy have been reviewed with the patient.  Questions have been answered.  All parties agreeable.   Sue Brooklyn, MD  03/16/2024, 9:11 AM

## 2024-03-16 NOTE — Op Note (Signed)
 [facility] Patient Name: Sue Calhoun Procedure Date: 03/16/2024 9:07 AM MRN: [id number] Account #: [id number] Date of Birth: June 08, 1951 Admit Type: Outpatient Age: 73 Room: [facility] ENDO ROOM 4 Gender: Female Note Status: Finalized Instrument Name: Colon Scope [id number] Procedure:             Colonoscopy Indications:           Surveillance: Personal history of adenomatous polyps                         on last colonoscopy > 5 years ago Providers:             Corinn Jess Brooklyn MD, MD Referring MD:          [facility] (Referring MD) Medicines:             General Anesthesia Complications:         No immediate complications. Estimated blood loss: None. Procedure:             Pre-Anesthesia Assessment:                        - Prior to the procedure, a History and Physical was                         performed, and patient medications and allergies were                         reviewed. The patient is competent. The risks and                         benefits of the procedure and the sedation options and                         risks were discussed with the patient. All questions                         were answered and informed consent was obtained.                         Patient identification and proposed procedure were                         verified by the physician, the nurse, the                         anesthesiologist, the anesthetist and the technician                         in the pre-procedure area in the procedure room in the                         endoscopy suite. Mental Status Examination: alert and                         oriented. Airway Examination: normal oropharyngeal                         airway and neck mobility. Respiratory Examination:  clear to auscultation. CV Examination: normal.                         Prophylactic Antibiotics: The patient does not require                          prophylactic antibiotics. Prior Anticoagulants: The                         patient has taken no anticoagulant or antiplatelet                         agents. ASA Grade Assessment: II - A patient with mild                         systemic disease. After reviewing the risks and                         benefits, the patient was deemed in satisfactory                         condition to undergo the procedure. The anesthesia                         plan was to use general anesthesia. Immediately prior                         to administration of medications, the patient was                         re-assessed for adequacy to receive sedatives. The                         heart rate, respiratory rate, oxygen saturations,                         blood pressure, adequacy of pulmonary ventilation, and                         response to care were monitored throughout the                         procedure. The physical status of the patient was                         re-assessed after the procedure.                        After obtaining informed consent, the colonoscope was                         passed under direct vision. Throughout the procedure,                         the patient's blood pressure, pulse, and oxygen                         saturations were monitored continuously. The  Colonoscope was introduced through the anus and                         advanced to the the terminal ileum, with                         identification of the appendiceal orifice and IC                         valve. The colonoscopy was performed without                         difficulty. The patient tolerated the procedure well.                         The quality of the bowel preparation was evaluated                         using the BBPS [facility] Bowel Preparation Scale) with                         scores of: Right Colon = 3, Transverse Colon = 3 and                         Left  Colon = 3 (entire mucosa seen well with no                         residual staining, small fragments of stool or opaque                         liquid). The total BBPS score equals 9. The terminal                         ileum, ileocecal valve, appendiceal orifice, and                         rectum were photographed. Findings:      The perianal and digital rectal examinations were normal. Pertinent       negatives include normal sphincter tone and no palpable rectal lesions.      The terminal ileum appeared normal.      A 4 mm polyp was found in the transverse colon. The polyp was sessile.       The polyp was removed with a cold snare. Resection and retrieval were       complete. Estimated blood loss: none.      A small post surgical scar was found at the appendiceal orifice. There       was no evidence of the previous polyp. The scar was unremarkable in       appearance.      A few diverticula were found in the recto-sigmoid colon.      Non-bleeding external hemorrhoids were found during retroflexion. The       hemorrhoids were medium-sized. Impression:            - The examined portion of the ileum was normal.                        - One 4  mm polyp in the transverse colon, removed with                         a cold snare. Resected and retrieved.                        - Diverticulosis in the recto-sigmoid colon.                        - Non-bleeding external hemorrhoids. Recommendation:        - Discharge patient to home (with escort).                        - Resume previous diet today.                        - Continue present medications.                        - Await pathology results.                        - Repeat colonoscopy in 5 to 7 years for surveillance. Procedure Code(s):     --- Professional ---                        [phone], Colonoscopy, flexible; with removal of                         tumor(s), polyp(s), or other lesion(s) by snare                          technique Diagnosis Code(s):     --- Professional ---                        Z86.010, Personal history of colonic polyps                        K64.4, Residual hemorrhoidal skin tags                        D12.3, Benign neoplasm of transverse colon (hepatic                         flexure or splenic flexure)                        K57.30, Diverticulosis of large intestine without                         perforation or abscess without bleeding CPT copyright 2022 American Medical Association. All rights reserved. The codes documented in this report are preliminary and upon coder review may  be revised to meet current compliance requirements. Dr. Corinn Brooklyn Corinn Jess Brooklyn MD, MD 03/16/2024 9:40:02 AM This report has been signed electronically. Number of Addenda: 0 Note Initiated On: 03/16/2024 9:07 AM Scope Withdrawal Time: 0 hours 10 minutes 59 seconds  Total Procedure Duration: 0 hours 14 minutes 23 seconds  Estimated Blood Loss:  Estimated blood loss: none.      [facility]

## 2024-03-16 NOTE — Anesthesia Preprocedure Evaluation (Signed)
 Anesthesia Evaluation  Patient identified by MRN, date of birth, ID band Patient awake  General Assessment Comment:Patient does not see doctors regularly  Reviewed: Allergy & Precautions, NPO status , Patient's Chart, lab work & pertinent test results  History of Anesthesia Complications Negative for: history of anesthetic complications  Airway Mallampati: II  TM Distance: >3 FB Neck ROM: Full    Dental  (+) Edentulous Upper, Edentulous Lower, Dental Advidsory Given   Pulmonary neg pulmonary ROS, neg shortness of breath, neg sleep apnea, neg COPD, neg recent URI, Patient abstained from smoking.Not current smoker   Pulmonary exam normal breath sounds clear to auscultation       Cardiovascular Exercise Tolerance: Good METS: 5 - 7 Mets (-) hypertension(-) angina (-) CAD and (-) Past MI (-) dysrhythmias  Rhythm:Regular Rate:Normal - Systolic murmurs    Neuro/Psych  PSYCHIATRIC DISORDERS Anxiety Depression    negative neurological ROS     GI/Hepatic ,neg GERD  ,,(+)     (-) substance abuse    Endo/Other  diabetes, Well Controlled, Type 2, Oral Hypoglycemic Agents    Renal/GU negative Renal ROS     Musculoskeletal   Abdominal  (+) + obese  Peds  Hematology   Anesthesia Other Findings Past Medical History: No date: Anxiety No date: Diabetes mellitus without complication (HCC)     Comment:  not started on medication yet  Reproductive/Obstetrics                              Anesthesia Physical Anesthesia Plan  ASA: 2  Anesthesia Plan: General   Post-op Pain Management:    Induction: Intravenous  PONV Risk Score and Plan: 3 and Treatment may vary due to age or medical condition, Propofol  infusion and TIVA  Airway Management Planned: Natural Airway and Nasal Cannula  Additional Equipment: None  Intra-op Plan:   Post-operative Plan:   Informed Consent: I have reviewed the patients  History and Physical, chart, labs and discussed the procedure including the risks, benefits and alternatives for the proposed anesthesia with the patient or authorized representative who has indicated his/her understanding and acceptance.     Dental advisory given  Plan Discussed with: CRNA and Surgeon  Anesthesia Plan Comments: (Discussed risks of anesthesia with patient, including PONV, sore throat, lip/dental/eye damage. Rare risks discussed as well, such as cardiorespiratory and neurological sequelae, and allergic reactions. Discussed the role of CRNA in patient's perioperative care. Patient understands.)         Anesthesia Quick Evaluation

## 2024-03-16 NOTE — Progress Notes (Unsigned)
 [facility] CONSULT NOTE  Patient Care Team: [facility] as PCP - General Sue, Garnette BIRCH, MD as Consulting Physician (Obstetrics and Gynecology) Elby Webb Loges, MD as Consulting Physician (Gynecologic Oncology) Unk Corinn Skiff, MD as Consulting Physician (Gastroenterology) Jordis Laneta FALCON, MD as Consulting Physician (General Surgery) Melanee Annah BROCKS, MD as Consulting Physician (Oncology) Rennie Cindy SAUNDERS, MD as Consulting Physician (Oncology)  CHIEF COMPLAINTS/PURPOSE OF CONSULTATION: ***  Oncology History   No history exists.    HISTORY OF PRESENTING ILLNESS: Patient ambulating- independently.   Alone/Accompanied by family.   Sue Calhoun 73 y.o.  female pleasant patient with a    Sue Calhoun is a 73 y.o. female referred by Dr. Epifanio, Alm Dover, MD for consultation & management of to discuss about colonoscopy. 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 There was no evidence of metastatic disease. Patient was seen by Dr. Jordis who recommended right hemicolectomy given the size of tumor. She subsequently underwent colonoscopy which revealed subcentimeter tubular adenomas of the colon only. Patient did not opt for right hemicolectomy because there was a lot going on in her personal life. She was also taking care of her son. Her 2 sisters passed away within last 2 years and she is coping from the loss. She has insomnia, anxiety, depression, is very overwhelmed and she is not sure if she is ready for surgery.  NSAIDs: None   ROS  MEDICAL HISTORY:  Past Medical History:  Diagnosis Date   Anxiety    Cancer of appendix (HCC)    Diabetes mellitus without complication (HCC)    not started on medication yet   Endometrial ca [facility])    Endometrial mass     SURGICAL HISTORY: Past Surgical History:  Procedure Laterality Date   APPENDECTOMY      COLONOSCOPY WITH PROPOFOL  N/A 10/15/2021   Procedure: COLONOSCOPY WITH PROPOFOL ;  Surgeon: Unk Corinn Skiff, MD;  Location: [facility] ENDOSCOPY;  Service: Gastroenterology;  Laterality: N/A;   HYSTEROSCOPY WITH D & C N/A 07/25/2021   Procedure: DILATATION AND CURETTAGE /HYSTEROSCOPY -REMOVAL OF CERVICAL/ UTERINE MASS;  Surgeon: Sue Garnette BIRCH, MD;  Location: [facility] ORS;  Service: Gynecology;  Laterality: N/A;   NO PAST SURGERIES     ROBOTIC ASSISTED TOTAL HYSTERECTOMY WITH BILATERAL SALPINGO OOPHERECTOMY Bilateral 09/04/2021   Procedure: XI ROBOTIC ASSISTED TOTAL HYSTERECTOMY WITH BILATERAL SALPINGO OOPHORECTOMY, SENTINEL LYMPH NODE INJECTION MAPPING SKIN TAG REMOVAL; partial cecectomy;  Surgeon: Elby Webb Loges, MD;  Location: [facility] ORS;  Service: Gynecology;  Laterality: Bilateral;    SOCIAL HISTORY: Social History   Socioeconomic History   Marital status: Single    Spouse name: Not on file   Number of children: 1   Years of education: Not on file   Highest education level: Not on file  Occupational History   Not on file  Tobacco Use   Smoking status: Never   Smokeless tobacco: Never  Vaping Use   Vaping status: Never Used  Substance and Sexual Activity   Alcohol use: Not Currently    Comment: rarely   Drug use: Never   Sexual activity: Not Currently    Birth control/protection: Post-menopausal  Other Topics Concern   Not on file  Social History Narrative   ** Merged History Encounter **       Lives with son. She works as a Engineer, structural to an elderly gentleman. Helps to take care of her  5 sisters, all of whom are disabled.    Social Drivers of Corporate investment banker Strain: Not on file  Food Insecurity: Not on file  Transportation Needs: Not on file  Physical Activity: Not on file  Stress: Not on file  Social Connections: Unknown (11/04/2021)   [facility]   Social Network    Social Network: Not on file  Intimate Partner Violence: Unknown  (10/01/2021)   Received from [facility]   HITS    Physically Hurt: Not on file    Insult or Talk Down To: Not on file    Threaten Physical Harm: Not on file    Scream or Curse: Not on file    FAMILY HISTORY: Family History  Problem Relation Age of Onset   Diabetes Mother    Cancer Mother 41       thyroid  cancer   Vision loss Father    Diabetes Father    Diabetes Sister    Skin cancer Sister    Diabetes Sister    Diabetes Sister    Diabetes Sister    Diabetes Sister    Diabetes Brother    Breast cancer Maternal Grandmother 45    ALLERGIES:  is allergic to nitrofurantoin  macrocrystal.  MEDICATIONS:  Current Outpatient Medications  Medication Sig Dispense Refill   ALPRAZolam (XANAX) 1 MG tablet Take 1 mg by mouth 3 (three) times daily as needed for anxiety.     atorvastatin (LIPITOR) 10 MG tablet Take 10 mg by mouth daily.     metFORMIN (GLUCOPHAGE-XR) 500 MG 24 hr tablet Take by mouth.     Spirulina 500 MG TABS Take 500-1,000 mg by mouth daily.     traZODone (DESYREL) 50 MG tablet Take 50 mg by mouth at bedtime.     No current facility-administered medications for this visit.    PHYSICAL EXAMINATION:   There were no vitals filed for this visit. There were no vitals filed for this visit.  Physical Exam  LABORATORY DATA:  I have reviewed the data as listed Lab Results  Component Value Date   WBC 12.0 (H) 07/17/2021   HGB 13.4 07/17/2021   HCT 40.1 07/17/2021   MCV 94.1 07/17/2021   PLT 352 07/17/2021   No results for input(s): NA, K, CL, CO2, GLUCOSE, BUN, CREATININE, CALCIUM, GFRNONAA, GFRAA, PROT, ALBUMIN, AST, ALT, ALKPHOS, BILITOT, BILIDIR, IBILI in the last 8760 hours.  RADIOGRAPHIC STUDIES: I have personally reviewed the radiological images as listed and agreed with the findings in the report. No results found.   No problem-specific Assessment & Plan notes found for this encounter.    Above plan of care was  discussed with patient/family in detail.  My contact information was given to the patient/family.       Cindy JONELLE Joe, MD 03/16/2024 10:05 PM

## 2024-03-16 NOTE — Transfer of Care (Signed)
 Immediate Anesthesia Transfer of Care Note  Patient: Sue Calhoun  Procedure(s) Performed: COLONOSCOPY POLYPECTOMY, INTESTINE  Patient Location: PACU  Anesthesia Type:General  Level of Consciousness: sedated  Airway & Oxygen Therapy: Patient Spontanous Breathing  Post-op Assessment: Report given to RN and Post -op Vital signs reviewed and stable  Post vital signs: Reviewed and stable  Last Vitals:  Vitals Value Taken Time  BP 116/69 03/16/24 09:40  Temp    Pulse 63 03/16/24 09:40  Resp 18 03/16/24 09:40  SpO2 96 % 03/16/24 09:40    Last Pain:  Vitals:   03/16/24 0940  TempSrc:   PainSc: Asleep         Complications: No notable events documented.

## 2024-03-17 ENCOUNTER — Encounter: Payer: Self-pay | Admitting: Gastroenterology

## 2024-03-17 ENCOUNTER — Telehealth: Payer: Self-pay

## 2024-03-17 LAB — SURGICAL PATHOLOGY

## 2024-03-17 NOTE — Anesthesia Postprocedure Evaluation (Signed)
 Anesthesia Post Note  Patient: Sue Calhoun  Procedure(s) Performed: COLONOSCOPY POLYPECTOMY, INTESTINE  Patient location during evaluation: Endoscopy Anesthesia Type: General Level of consciousness: awake and alert Pain management: pain level controlled Vital Signs Assessment: post-procedure vital signs reviewed and stable Respiratory status: spontaneous breathing, nonlabored ventilation, respiratory function stable and patient connected to nasal cannula oxygen Cardiovascular status: blood pressure returned to baseline and stable Postop Assessment: no apparent nausea or vomiting Anesthetic complications: no   No notable events documented.   Last Vitals:  Vitals:   03/16/24 0844 03/16/24 0940  BP: (!) 146/64 116/69  Pulse: (!) 50 63  Resp: 18 18  Temp: (!) 35.8 C   SpO2: 100% 96%    Last Pain:  Vitals:   03/16/24 0940  TempSrc:   PainSc: Asleep                 Prentice Murphy

## 2024-03-17 NOTE — Telephone Encounter (Signed)
 Spoke with Sue Calhoun regarding her appointment tomorrow. Explained that this appointment was to follow up on the low grade appendiceal mucinous neoplasm that was discovered during her hysterectomy 08/2021. She was referred and seen by Dr. Jordis who recommended a right hemicolectomy. At that time she declined surgery. [name] verbalized understanding and will come to appointment tomorrow.

## 2024-03-18 ENCOUNTER — Inpatient Hospital Stay: Attending: Internal Medicine | Admitting: Internal Medicine

## 2024-03-18 ENCOUNTER — Ambulatory Visit: Payer: Self-pay | Admitting: Gastroenterology

## 2024-03-18 ENCOUNTER — Inpatient Hospital Stay

## 2024-03-18 ENCOUNTER — Encounter: Payer: Self-pay | Admitting: Internal Medicine

## 2024-03-18 VITALS — BP 161/74 | HR 59 | Temp 98.3°F | Resp 18 | Ht 65.0 in | Wt 227.5 lb

## 2024-03-18 DIAGNOSIS — Z7984 Long term (current) use of oral hypoglycemic drugs: Secondary | ICD-10-CM | POA: Diagnosis not present

## 2024-03-18 DIAGNOSIS — D373 Neoplasm of uncertain behavior of appendix: Secondary | ICD-10-CM | POA: Diagnosis not present

## 2024-03-18 DIAGNOSIS — C181 Malignant neoplasm of appendix: Secondary | ICD-10-CM | POA: Insufficient documentation

## 2024-03-18 DIAGNOSIS — E119 Type 2 diabetes mellitus without complications: Secondary | ICD-10-CM | POA: Insufficient documentation

## 2024-03-18 DIAGNOSIS — Z808 Family history of malignant neoplasm of other organs or systems: Secondary | ICD-10-CM | POA: Insufficient documentation

## 2024-03-18 DIAGNOSIS — Z803 Family history of malignant neoplasm of breast: Secondary | ICD-10-CM | POA: Diagnosis not present

## 2024-03-18 DIAGNOSIS — Z79899 Other long term (current) drug therapy: Secondary | ICD-10-CM | POA: Diagnosis not present

## 2024-03-18 DIAGNOSIS — Z90722 Acquired absence of ovaries, bilateral: Secondary | ICD-10-CM | POA: Diagnosis not present

## 2024-03-18 DIAGNOSIS — Z8542 Personal history of malignant neoplasm of other parts of uterus: Secondary | ICD-10-CM | POA: Diagnosis not present

## 2024-03-18 DIAGNOSIS — Z9079 Acquired absence of other genital organ(s): Secondary | ICD-10-CM | POA: Insufficient documentation

## 2024-03-18 DIAGNOSIS — Z90710 Acquired absence of both cervix and uterus: Secondary | ICD-10-CM | POA: Diagnosis not present

## 2024-03-18 NOTE — Progress Notes (Signed)
NO questions at this time 

## 2024-03-18 NOTE — Assessment & Plan Note (Addendum)
#   Incidental -[April 2023-during TAH/BSO]APPENDIX; APPENDECTOMY: - LOW-GRADE APPENDICEAL MUCINOUS NEOPLASM, WITH ACELLULAR MUCIN PRESENT AT SEROSAL SURFACE. pT4 Nx- Tumor site: Distal half of appendix  Histologic type: Low-grade appendiceal mucinous neoplasm Histologic Grade: G1, well-differentiated Tumor Size: 3.3 x 3.1 x 2.4 cm  Tumor Deposits: Not identified Tumor Extent: Acellular mucin invades visceral peritoneum (serosa); Lymphovascular Invasion: Not identified. SEP 2025-colonoscopy negative for any obvious recurrence around the appendix.  Surgical scar noted.   # In the past as per the NCCN guidelines-right hemicolectomy is recommended to cut down the risk of recurrence.  Patient status post evaluation with Dr. Jordis.  However patient reluctant.  However at this time patient is open to imaging.  I discussed that would recommend a CT scan abdomen pelvis-to have a discussion regarding her options.  # Endometrial cancer -FIGO Stage (2018 FIGO Cancer Report): IAotal hysterectomy and bilateral salpingo-oophorectomy-likely cured of her cancer.  Thank you Dr. Unk for allowing me to participate in the care of your pleasant patient. Please do not hesitate to contact me with questions or concerns in the interim.  Discussed with Kristi Stanton.  # DISPOSITION: # Repeat BP  # No labs- # CT AP in 1 week  # follow TBD- Dr.B

## 2024-03-18 NOTE — Progress Notes (Signed)
 Met with Sue Calhoun. We will arrange Ct scan at [facility]. Dr. Rennie will see her a few days following for results.

## 2024-03-22 ENCOUNTER — Encounter: Payer: Self-pay | Admitting: Internal Medicine

## 2024-03-23 DIAGNOSIS — F5101 Primary insomnia: Secondary | ICD-10-CM | POA: Diagnosis not present

## 2024-03-23 DIAGNOSIS — F32A Depression, unspecified: Secondary | ICD-10-CM | POA: Diagnosis not present

## 2024-03-23 DIAGNOSIS — E782 Mixed hyperlipidemia: Secondary | ICD-10-CM | POA: Diagnosis not present

## 2024-03-23 DIAGNOSIS — E66812 Obesity, class 2: Secondary | ICD-10-CM | POA: Diagnosis not present

## 2024-03-23 DIAGNOSIS — E119 Type 2 diabetes mellitus without complications: Secondary | ICD-10-CM | POA: Diagnosis not present

## 2024-03-23 DIAGNOSIS — Z6836 Body mass index (BMI) 36.0-36.9, adult: Secondary | ICD-10-CM | POA: Diagnosis not present

## 2024-03-23 DIAGNOSIS — E6609 Other obesity due to excess calories: Secondary | ICD-10-CM | POA: Diagnosis not present

## 2024-03-25 ENCOUNTER — Ambulatory Visit (HOSPITAL_BASED_OUTPATIENT_CLINIC_OR_DEPARTMENT_OTHER)

## 2024-03-29 ENCOUNTER — Other Ambulatory Visit (HOSPITAL_BASED_OUTPATIENT_CLINIC_OR_DEPARTMENT_OTHER)

## 2024-03-29 DIAGNOSIS — E66812 Obesity, class 2: Secondary | ICD-10-CM | POA: Diagnosis not present

## 2024-03-29 DIAGNOSIS — Z6836 Body mass index (BMI) 36.0-36.9, adult: Secondary | ICD-10-CM | POA: Diagnosis not present

## 2024-03-29 DIAGNOSIS — J069 Acute upper respiratory infection, unspecified: Secondary | ICD-10-CM | POA: Diagnosis not present

## 2024-03-29 DIAGNOSIS — F5101 Primary insomnia: Secondary | ICD-10-CM | POA: Diagnosis not present

## 2024-03-29 DIAGNOSIS — E6609 Other obesity due to excess calories: Secondary | ICD-10-CM | POA: Diagnosis not present

## 2024-03-29 DIAGNOSIS — Z1331 Encounter for screening for depression: Secondary | ICD-10-CM | POA: Diagnosis not present

## 2024-03-29 DIAGNOSIS — Z23 Encounter for immunization: Secondary | ICD-10-CM | POA: Diagnosis not present

## 2024-03-29 DIAGNOSIS — E119 Type 2 diabetes mellitus without complications: Secondary | ICD-10-CM | POA: Diagnosis not present

## 2024-03-29 DIAGNOSIS — E782 Mixed hyperlipidemia: Secondary | ICD-10-CM | POA: Diagnosis not present

## 2024-03-29 DIAGNOSIS — C181 Malignant neoplasm of appendix: Secondary | ICD-10-CM | POA: Diagnosis not present

## 2024-04-04 DIAGNOSIS — E785 Hyperlipidemia, unspecified: Secondary | ICD-10-CM | POA: Diagnosis not present

## 2024-04-04 DIAGNOSIS — R7303 Prediabetes: Secondary | ICD-10-CM | POA: Diagnosis not present

## 2024-04-04 DIAGNOSIS — R419 Unspecified symptoms and signs involving cognitive functions and awareness: Secondary | ICD-10-CM | POA: Diagnosis not present

## 2024-05-20 ENCOUNTER — Telehealth: Payer: Self-pay

## 2024-05-20 NOTE — Telephone Encounter (Signed)
 Call placed to Sue Calhoun regarding CT scan. She does not want to reschedule at this time. She is tending to her son who was hospitalized. She will call back when ready to schedule.
